# Patient Record
Sex: Female | Born: 1985 | Race: Black or African American | Hispanic: No | Marital: Single | Smoking: Former smoker
Health system: Southern US, Community
[De-identification: ages and names within clinical notes are randomized; demographics above are authoritative.]

## PROBLEM LIST (undated history)

## (undated) DIAGNOSIS — F32A Depression, unspecified: Secondary | ICD-10-CM

## (undated) DIAGNOSIS — J45909 Unspecified asthma, uncomplicated: Secondary | ICD-10-CM

## (undated) DIAGNOSIS — IMO0001 Reserved for inherently not codable concepts without codable children: Secondary | ICD-10-CM

## (undated) DIAGNOSIS — F329 Major depressive disorder, single episode, unspecified: Secondary | ICD-10-CM

## (undated) DIAGNOSIS — I10 Essential (primary) hypertension: Secondary | ICD-10-CM

## (undated) DIAGNOSIS — F419 Anxiety disorder, unspecified: Secondary | ICD-10-CM

## (undated) DIAGNOSIS — R112 Nausea with vomiting, unspecified: Secondary | ICD-10-CM

## (undated) DIAGNOSIS — Z9889 Other specified postprocedural states: Secondary | ICD-10-CM

## (undated) HISTORY — PX: ADENOIDECTOMY: SUR15

## (undated) HISTORY — PX: TONSILLECTOMY: SUR1361

## (undated) HISTORY — PX: SALPINGOOPHORECTOMY: SHX82

---
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
## 2008-08-09 ENCOUNTER — Ambulatory Visit: Payer: Self-pay | Admitting: *Deleted

## 2008-08-09 ENCOUNTER — Inpatient Hospital Stay (HOSPITAL_COMMUNITY): Admission: RE | Admit: 2008-08-09 | Discharge: 2008-08-12 | Payer: Self-pay | Admitting: *Deleted

## 2010-08-21 LAB — CBC
HCT: 36.5 % (ref 36.0–46.0)
Hemoglobin: 12 g/dL (ref 12.0–15.0)
MCHC: 32.9 g/dL (ref 30.0–36.0)
RBC: 4.6 MIL/uL (ref 3.87–5.11)
WBC: 5.3 10*3/uL (ref 4.0–10.5)

## 2010-08-21 LAB — URINALYSIS, ROUTINE W REFLEX MICROSCOPIC
Bilirubin Urine: NEGATIVE
Hgb urine dipstick: NEGATIVE
Protein, ur: NEGATIVE mg/dL
Specific Gravity, Urine: 1.031 — ABNORMAL HIGH (ref 1.005–1.030)
pH: 6.5 (ref 5.0–8.0)

## 2010-08-21 LAB — COMPREHENSIVE METABOLIC PANEL
Albumin: 3.5 g/dL (ref 3.5–5.2)
BUN: 12 mg/dL (ref 6–23)
CO2: 25 mEq/L (ref 19–32)
Calcium: 9 mg/dL (ref 8.4–10.5)
Chloride: 107 mEq/L (ref 96–112)
Creatinine, Ser: 0.76 mg/dL (ref 0.4–1.2)
Potassium: 3.5 mEq/L (ref 3.5–5.1)
Sodium: 139 mEq/L (ref 135–145)

## 2010-08-21 LAB — DRUGS OF ABUSE SCREEN W/O ALC, ROUTINE URINE
Amphetamine Screen, Ur: NEGATIVE
Barbiturate Quant, Ur: NEGATIVE
Phencyclidine (PCP): NEGATIVE

## 2010-08-21 LAB — TSH: TSH: 1.707 u[IU]/mL (ref 0.350–4.500)

## 2010-08-21 LAB — THC (MARIJUANA), URINE, CONFIRMATION: Marijuana, Ur-Confirmation: 28 ng/mL

## 2010-09-27 NOTE — Discharge Summary (Signed)
NAME:  Candace Sanders, Candace Sanders NO.:  [id number]   MEDICAL RECORD NO.:  [id number]          PATIENT TYPE:  IPS   LOCATION:  0302                          FACILITY:  [facility]   PHYSICIAN:  Jasmine Pang, M.D. DATE OF BIRTH:  04-18-1986   DATE OF ADMISSION:  08/09/2008  DATE OF DISCHARGE:  08/12/2008                               DISCHARGE SUMMARY   IDENTIFICATION:  This is a 25 year old single African American female  who was admitted on a voluntary basis on August 09, 2008.   HISTORY OF PRESENT ILLNESS:  The patient has had suicidal thoughts that  have been worse for the past 2 weeks.  She reports neurovegetative  symptoms including frequent tearfulness, poor concentration, unable to  sleep, anhedonia, and anergia.  For further admission information, see  psychiatric admission assessment.   PHYSICAL FINDINGS:  There were no acute physical or medical problems  noted.  Physical exam was unremarkable.   ADMISSION LABORATORIES:  TSH was within normal limits.   HOSPITAL COURSE:  Upon admission, the patient was continued on her home  supply of Sprintec birth control pill (0.25/0.0035) p.o. daily.  She was  also started on Ambien 5 mg 1-2 pills at bedtime.  In individual  sessions, the patient stated she has been depressed since elementary  school.  She admits to using occasional alcohol on weekends, but denies  this is excessive.  She denied any abuse as a child.  She stated her  parents were supportive.  She has gone to [facility] for treatment  in the past.  Stressors revolve around being disappointed in self.  She worries that her parents were disappointed with her.  She states she  has difficulty keeping friends.  She feels she is not doing well in  school at [employer].  As hospitalization progressed, sleep was  good.  Appetite was good.  The patient's mood began to improve.  She  discussed the recent breakup with a boyfriend and was sad about this.  Parents  had been visiting while she was in the hospital and are very  supportive.  She decided she did not want any psychiatric meds.  We  decided to refer her to therapy as an outpatient.  She also wanted to  return to school.  On August 12, 2008, family session was held with the  patient's parents.  They were very supportive and wanted her to be more  open with them.  The patient's mood on August 12, 2008 was euthymic.  Affect was wide range.  There was no suicidal or homicidal ideation.  No  thoughts of self-injurious behavior.  No auditory or visual  hallucinations.  No paranoia or delusions.  Thoughts were logical and  goal-directed.  Thought content, no predominant theme.  Cognitive was  grossly intact.  Insight good.  Judgment good.  Impulse control good.  It was felt the patient was safe for discharge today.   DISCHARGE DIAGNOSES:  Axis I: Depressive disorder, not otherwise  specified.  Axis II:  None.  Axis III:  No diagnosis.  Axis IV:  Moderate to  severe (relationship and school stressors, burden  of psychiatric illness).  Axis V:  Global assessment of functioning was 60 upon discharge.  GAF  was 54 upon admission.  GAF highest past year was 70.   DISCHARGE PLANS:  There was no specific activity level or dietary  restrictions.   POSTHOSPITAL CARE PLANS:  The patient will go to [facility] on  April 13 at 2 o'clock p.m.  She will also go to [facility] in [place] for a counselor and psychiatrist.   DISCHARGE MEDICATIONS:  Sprintec 0.25 mg/0.035 mg tablets as directed by  her primary care doctor.      Jasmine Pang, M.D.  Electronically Signed     [facility]/MEDQ  D:  08/24/2008  T:  08/25/2008  Job:  [id number]

## 2014-04-28 ENCOUNTER — Emergency Department
Admission: EM | Admit: 2014-04-28 | Discharge: 2014-04-28 | Disposition: A | Discharge status: Home / Self Care | Payer: Managed Care, Other (non HMO) | Source: Home / Self Care | Attending: Emergency Medicine | Admitting: Emergency Medicine

## 2014-04-28 ENCOUNTER — Encounter: Payer: Self-pay | Admitting: *Deleted

## 2014-04-28 DIAGNOSIS — J0100 Acute maxillary sinusitis, unspecified: Secondary | ICD-10-CM

## 2014-04-28 MED ORDER — AMOXICILLIN 875 MG PO TABS: ORAL_TABLET | ORAL | Status: DC

## 2014-04-28 MED ORDER — FLUTICASONE PROPIONATE 50 MCG/ACT NA SUSP: NASAL | Status: DC

## 2014-04-28 NOTE — ED Notes (Signed)
Merri c/o congestion, cough, ear itching, sore throat and hoarseness 2 days. Taken [employer], advertising OTC. No flu vac this season

## 2014-04-28 NOTE — ED Provider Notes (Signed)
CSN: [id number]     Arrival date & time 04/28/14  0847 History   First MD Initiated Contact with Patient 04/28/14 [phone]     Chief Complaint  Patient presents with  . Nasal Congestion  . Cough   (Consider location/radiation/quality/duration/timing/severity/associated sxs/prior Treatment) HPI SINUSITIS  Onset: 3-4 days Facial/sinus pressure with discolored nasal mucus.    Severity: moderate Tried OTC meds without significant relief.  Symptoms:  + Fever  + URI prodrome with nasal congestion + Minimal swollen neck glands + mild Sinus Headache + mild ear pressure  No Allergy symptoms No significant Sore Throat No eye symptoms     No significant Cough No chest pain No shortness of breath  No wheezing  No Abdominal Pain No Nausea No Vomiting No diarrhea  No Myalgias No focal neurologic symptoms No syncope No Rash  No Urinary symptoms   Denies chance of pregnancy. LMP 04/04/2014       History reviewed. No pertinent past medical history. Past Surgical History  Procedure Laterality Date  . Tonsillectomy     Family History  Problem Relation Age of Onset  . Hyperlipidemia Mother   . Hypertension Father    History  Substance Use Topics  . Smoking status: Never Smoker   . Smokeless tobacco: Current User     Comment: vape  . Alcohol Use: Yes   OB History    No data available     Review of Systems  All other systems reviewed and are negative.   Allergies  Review of patient's allergies indicates no known allergies.  Home Medications   Prior to Admission medications   Medication Sig Start Date End Date Taking? Authorizing Provider  norethindrone-ethinyl estradiol-iron (ESTROSTEP FE,TILIA FE,TRI-LEGEST FE) 1-20/1-30/1-35 MG-MCG tablet Take 1 tablet by mouth daily.   Yes Historical Provider, MD  amoxicillin (AMOXIL) 875 MG tablet Take 1 twice a day X 10 days. 04/28/14   Jacqulyn Cane, MD  fluticasone Asencion Islam) 50 MCG/ACT nasal spray 1 or 2 sprays each  nostril twice a day 04/28/14   Jacqulyn Cane, MD   BP 144/87 mmHg  Pulse 94  Temp(Src) 98.2 F (36.8 C) (Oral)  Resp 16  Ht 5\' 7"  (1.702 m)  Wt 352 lb (159.666 kg)  BMI 55.12 kg/m2  SpO2 98%  LMP 04/04/2014 Physical Exam  Constitutional: She is oriented to person, place, and time. She appears well-developed and well-nourished. No distress.  HENT:  Head: Normocephalic and atraumatic.  Right Ear: Tympanic membrane, external ear and ear canal normal.  Left Ear: Tympanic membrane, external ear and ear canal normal.  Nose: Mucosal edema and rhinorrhea present. Right sinus exhibits maxillary sinus tenderness. Left sinus exhibits maxillary sinus tenderness.  Mouth/Throat: Oropharynx is clear and moist. No oral lesions. No oropharyngeal exudate.  Tonsils are surgically absent.  Eyes: Right eye exhibits no discharge. Left eye exhibits no discharge. No scleral icterus.  Neck: Neck supple.  Cardiovascular: Normal rate, regular rhythm and normal heart sounds.   Pulmonary/Chest: Effort normal and breath sounds normal. She has no wheezes. She has no rales.  Lymphadenopathy:    She has no cervical adenopathy.  Neurological: She is alert and oriented to person, place, and time.  Skin: Skin is warm and dry.  Nursing note and vitals reviewed.   ED Course  Procedures (including critical care time) Labs Review Labs Reviewed - No data to display  Imaging Review No results found.   MDM   1. Acute maxillary sinusitis, recurrence not specified   Treatment  options discussed, as well as risks, benefits, alternatives. Patient voiced understanding and agreement with the following plans:  New Prescriptions   AMOXICILLIN (AMOXIL) 875 MG TABLET    Take 1 twice a day X 10 days.   FLUTICASONE (FLONASE) 50 MCG/ACT NASAL SPRAY    1 or 2 sprays each nostril twice a day   Other symptomatic care discussed. Follow-up with your primary care doctor in 5-7 days if not improving, or sooner if symptoms become  worse. Precautions discussed. Red flags discussed. Questions invited and answered. Patient voiced understanding and agreement.     Jacqulyn Cane, MD 04/28/14 [phone]

## 2014-05-10 ENCOUNTER — Encounter: Payer: Self-pay | Admitting: *Deleted

## 2014-05-10 ENCOUNTER — Emergency Department (INDEPENDENT_AMBULATORY_CARE_PROVIDER_SITE_OTHER)
Admission: EM | Admit: 2014-05-10 | Discharge: 2014-05-10 | Disposition: A | Discharge status: Home / Self Care | Payer: Managed Care, Other (non HMO) | Source: Home / Self Care | Attending: Family Medicine | Admitting: Family Medicine

## 2014-05-10 DIAGNOSIS — L500 Allergic urticaria: Secondary | ICD-10-CM

## 2014-05-10 DIAGNOSIS — J029 Acute pharyngitis, unspecified: Secondary | ICD-10-CM

## 2014-05-10 MED ORDER — PREDNISONE 20 MG PO TABS: 20.0000 mg | ORAL_TABLET | Freq: Two times a day (BID) | ORAL | Status: DC

## 2014-05-10 MED ORDER — METHYLPREDNISOLONE SODIUM SUCC 125 MG IJ SOLR
80.0000 mg | Freq: Once | INTRAMUSCULAR | Status: AC
  Administered 2014-05-10: 80 mg via INTRAMUSCULAR

## 2014-05-10 NOTE — ED Notes (Addendum)
Pt c/o allergic reaction to laundry detergent with hives all over x 7 pm last night.

## 2014-05-10 NOTE — Discharge Instructions (Signed)
Begin prednisone tomorrow.  Take a daily antihistamine such as Zyrtec or Allegra.   Hives Hives are itchy, red, swollen areas of the skin. They can vary in size and location on your body. Hives can come and go for hours or several days (acute hives) or for several weeks (chronic hives). Hives do not spread from person to person (noncontagious). They may get worse with scratching, exercise, and emotional stress. CAUSES   Allergic reaction to food, additives, or drugs.  Infections, including the common cold.  Illness, such as vasculitis, lupus, or thyroid disease.  Exposure to sunlight, heat, or cold.  Exercise.  Stress.  Contact with chemicals. SYMPTOMS   Red or white swollen patches on the skin. The patches may change size, shape, and location quickly and repeatedly.  Itching.  Swelling of the hands, feet, and face. This may occur if hives develop deeper in the skin. DIAGNOSIS  Your caregiver can usually tell what is wrong by performing a physical exam. Skin or blood tests may also be done to determine the cause of your hives. In some cases, the cause cannot be determined. TREATMENT  Mild cases usually get better with medicines such as antihistamines. Severe cases may require an emergency epinephrine injection. If the cause of your hives is known, treatment includes avoiding that trigger.  HOME CARE INSTRUCTIONS   Avoid causes that trigger your hives.  Take antihistamines as directed by your caregiver to reduce the severity of your hives. Non-sedating or low-sedating antihistamines are usually recommended. Do not drive while taking an antihistamine.  Take any other medicines prescribed for itching as directed by your caregiver.  Wear loose-fitting clothing.  Keep all follow-up appointments as directed by your caregiver. SEEK MEDICAL CARE IF:   You have persistent or severe itching that is not relieved with medicine.  You have painful or swollen joints. SEEK IMMEDIATE  MEDICAL CARE IF:   You have a fever.  Your tongue or lips are swollen.  You have trouble breathing or swallowing.  You feel tightness in the throat or chest.  You have abdominal pain. These problems may be the first sign of a life-threatening allergic reaction. Call your local emergency services (911 in [place] MAKE SURE YOU:   Understand these instructions.  Will watch your condition.  Will get help right away if you are not doing well or get worse. Document Released: 04/28/2005 Document Revised: 05/03/2013 Document Reviewed: 07/22/2011 [facility] Patient Information 2015 [place], [place]. This information is not intended to replace advice given to you by your health care provider. Make sure you discuss any questions you have with your health care provider.

## 2014-05-10 NOTE — ED Provider Notes (Signed)
CSN: [id number]     Arrival date & time 05/10/14  6301 History   First MD Initiated Contact with Patient 05/10/14 [phone]     Chief Complaint  Patient presents with  . Allergic Reaction     HPI Comments: Patient complains of onset of hives in her groin area and upper inner thighs yesterday.   The hives then appeared on her arms, hands, and torso.  She has also had some itching behind her ears.  She also notes onset of a mild sore throat.  No fevers, chills, and sweats.  She reports that her previous URI and sinusitis resolved; she finished amoxicillin 3 days ago.  Patient is a 28 y.o. female presenting with rash. The history is provided by the patient.  Rash Location:  Torso, leg and hand Hand rash location:  L hand and R hand Leg rash location:  L upper leg and R upper leg Quality: itchiness   Quality: not blistering and not painful   Severity:  Mild Onset quality:  Sudden Duration:  1 day Timing:  Constant Progression:  Worsening Chronicity:  New Context: new detergent/soap   Relieved by:  Nothing Worsened by:  Nothing tried Ineffective treatments:  Antihistamines and topical steroids Associated symptoms: sore throat   Associated symptoms: no fatigue, no fever, no headaches, no myalgias, no nausea, no throat swelling, no tongue swelling and no URI   Sore throat:    Severity:  Mild   Onset quality:  Gradual   Duration:  1 day   Timing:  Constant   Progression:  Unchanged   History reviewed. No pertinent past medical history. Past Surgical History  Procedure Laterality Date  . Tonsillectomy     Family History  Problem Relation Age of Onset  . Hyperlipidemia Mother   . Hypertension Father    History  Substance Use Topics  . Smoking status: Never Smoker   . Smokeless tobacco: Current User     Comment: vape  . Alcohol Use: Yes   OB History    No data available     Review of Systems  Constitutional: Negative for fever and fatigue.  HENT: Positive for sore throat.     Gastrointestinal: Negative for nausea.  Musculoskeletal: Negative for myalgias.  Skin: Positive for rash.  Neurological: Negative for headaches.  All other systems reviewed and are negative.   Allergies  Review of patient's allergies indicates no known allergies.  Home Medications   Prior to Admission medications   Medication Sig Start Date End Date Taking? Authorizing Provider  diphenhydrAMINE (BENADRYL) 25 mg capsule Take 25 mg by mouth every 6 (six) hours as needed.   Yes Historical Provider, MD  amoxicillin (AMOXIL) 875 MG tablet Take 1 twice a day X 10 days. 04/28/14   Jacqulyn Cane, MD  fluticasone Asencion Islam) 50 MCG/ACT nasal spray 1 or 2 sprays each nostril twice a day 04/28/14   Jacqulyn Cane, MD  norethindrone-ethinyl estradiol-iron (ESTROSTEP FE,TILIA FE,TRI-LEGEST FE) 1-20/1-30/1-35 MG-MCG tablet Take 1 tablet by mouth daily.    Historical Provider, MD  predniSONE (DELTASONE) 20 MG tablet Take 1 tablet (20 mg total) by mouth 2 (two) times daily. Take with food. 05/10/14   Kandra Nicolas, MD   BP 159/96 mmHg  Pulse 110  Temp(Src) 98.1 F (36.7 C) (Oral)  Resp 18  Ht 5\' 7"  (1.702 m)  Wt 352 lb (159.666 kg)  BMI 55.12 kg/m2  SpO2 97%  LMP 05/02/2014 Physical Exam  Constitutional: She is oriented to person, place,  and time. She appears well-developed and well-nourished. No distress.  Patient is obese (BMI 55.1)  HENT:  Head: Normocephalic and atraumatic.  Nose: Nose normal.  Mouth/Throat: Oropharynx is clear and moist.  Eyes: Conjunctivae and EOM are normal. Pupils are equal, round, and reactive to light.  Neck: Neck supple.  Tender shotty anterior nodes bilaterally  Cardiovascular: Normal heart sounds.   Pulmonary/Chest: Breath sounds normal.  Lymphadenopathy:    She has cervical adenopathy.  Neurological: She is alert and oriented to person, place, and time.  Skin: Skin is warm and dry. Rash noted. Rash is urticarial. No erythema.     Nursing note and vitals  reviewed.   ED Course  Procedures  None   Labs Reviewed  STREP A DNA PROBE  POCT RAPID STREP A (OFFICE) negative      MDM   1. Allergic urticaria   2. Acute pharyngitis, unspecified pharyngitis type    Throat culture pending. Solumedrol 80mg  IM; begin prednisone burst tomorrow.   Take a daily antihistamine such as Zyrtec or Allegra. Followup with Family Doctor if not improved in one week.     Kandra Nicolas, MD 05/10/14 [phone]

## 2014-05-11 LAB — STREP A DNA PROBE: GASP: NEGATIVE

## 2014-05-13 ENCOUNTER — Telehealth: Payer: Self-pay | Admitting: *Deleted

## 2014-10-30 ENCOUNTER — Encounter: Payer: Self-pay | Admitting: *Deleted

## 2014-10-30 ENCOUNTER — Emergency Department
Admission: EM | Admit: 2014-10-30 | Discharge: 2014-10-30 | Disposition: A | Discharge status: Home / Self Care | Payer: Managed Care, Other (non HMO) | Source: Home / Self Care | Attending: Family Medicine | Admitting: Family Medicine

## 2014-10-30 DIAGNOSIS — J069 Acute upper respiratory infection, unspecified: Secondary | ICD-10-CM

## 2014-10-30 DIAGNOSIS — B9789 Other viral agents as the cause of diseases classified elsewhere: Principal | ICD-10-CM

## 2014-10-30 HISTORY — DX: Essential (primary) hypertension: I10

## 2014-10-30 MED ORDER — BENZONATATE 200 MG PO CAPS
200.0000 mg | ORAL_CAPSULE | Freq: Every day | ORAL | Status: DC
Start: 2014-10-30 — End: 2015-01-03

## 2014-10-30 MED ORDER — PREDNISONE 20 MG PO TABS: 20.0000 mg | ORAL_TABLET | Freq: Two times a day (BID) | ORAL | Status: DC

## 2014-10-30 MED ORDER — AZITHROMYCIN 250 MG PO TABS: ORAL_TABLET | ORAL | Status: DC

## 2014-10-30 NOTE — Discharge Instructions (Signed)
Take plain guaifenesin (1200mg  extended release tabs such as Mucinex) twice daily, with plenty of water, for cough and congestion.  May add Pseudoephedrine (30mg , one or two every 4 to 6 hours) for sinus congestion.  Get adequate rest.   May use Afrin nasal spray (or generic oxymetazoline) twice daily for about 5 days.  Also recommend using saline nasal spray several times daily and saline nasal irrigation (AYR is a common brand).  Use Flonase nasal spray each morning after using Afrin nasal spray and saline nasal irrigation. Try warm salt water gargles for sore throat.  Stop all antihistamines for now, and other non-prescription cough/cold preparations. May take Ibuprofen 200mg , 4 tabs every 8 hours with food for sore throat, headache, etc. Begin Azithromycin if not improving about one week or if persistent fever develops   Follow-up with family doctor if not improving about10 days.

## 2014-10-30 NOTE — ED Notes (Signed)
Pt c/o productive cough, hoarseness and post nasal drip x 5 days. Denies fever.

## 2014-10-30 NOTE — ED Provider Notes (Signed)
CSN: [id number]     Arrival date & time 10/30/14  5621 History   First MD Initiated Contact with Patient 10/30/14 [phone]     Chief Complaint  Patient presents with  . Cough     HPI Comments: Patient complains of five day history of typical cold-like symptoms including mild sore throat, sinus congestion, headache, hoarseness, and cough.  She has had mild hives over the past several days improved with Benadryl. She has a history of seasonal allergies, and past history of childhood asthma.  She has a family history of asthma.  The history is provided by the patient.    Past Medical History  Diagnosis Date  . Hypertension    Past Surgical History  Procedure Laterality Date  . Tonsillectomy    . Adenoidectomy     Family History  Problem Relation Age of Onset  . Hyperlipidemia Mother   . Hypertension Father    History  Substance Use Topics  . Smoking status: Former Smoker    Quit date: 10/30/2010  . Smokeless tobacco: Current User     Comment: vape  . Alcohol Use: Yes   OB History    No data available     Review of Systems + sore throat + cough No pleuritic pain No wheezing + nasal congestion + post-nasal drainage No sinus pain/pressure No itchy/red eyes No earache No hemoptysis No SOB No fever/chills No nausea No vomiting No abdominal pain No diarrhea No urinary symptoms No skin rash No fatigue No myalgias + headache Used OTC meds without relief  Allergies  Review of patient's allergies indicates no known allergies.  Home Medications   Prior to Admission medications   Medication Sig Start Date End Date Taking? Authorizing Provider  azithromycin (ZITHROMAX Z-PAK) 250 MG tablet Take 2 tabs today; then begin one tab once daily for 4 more days. (Rx void after 11/07/14) 10/30/14   Kandra Nicolas, MD  benzonatate (TESSALON) 200 MG capsule Take 1 capsule (200 mg total) by mouth at bedtime. Take as needed for cough 10/30/14   Kandra Nicolas, MD  diphenhydrAMINE  (BENADRYL) 25 mg capsule Take 25 mg by mouth every 6 (six) hours as needed.    Historical Provider, MD  fluticasone Asencion Islam) 50 MCG/ACT nasal spray 1 or 2 sprays each nostril twice a day 04/28/14   Jacqulyn Cane, MD  norethindrone-ethinyl estradiol-iron (ESTROSTEP FE,TILIA FE,TRI-LEGEST FE) 1-20/1-30/1-35 MG-MCG tablet Take 1 tablet by mouth daily.    Historical Provider, MD  predniSONE (DELTASONE) 20 MG tablet Take 1 tablet (20 mg total) by mouth 2 (two) times daily. Take with food. 10/30/14   Kandra Nicolas, MD   BP 139/99 mmHg  Pulse 90  Temp(Src) 98.6 F (37 C) (Oral)  Resp 16  Ht 5\' 7"  (1.702 m)  Wt 357 lb (161.934 kg)  BMI 55.90 kg/m2  SpO2 98%  LMP 10/17/2014 Physical Exam Nursing notes and Vital Signs reviewed. Appearance:  Patient appears stated age, and in no acute distress.  Patient is obese (BMI 55.9) Eyes:  Pupils are equal, round, and reactive to light and accomodation.  Extraocular movement is intact.  Conjunctivae are not inflamed  Ears:  Canals normal.  Tympanic membranes normal.  Nose:  Congested turbinates.  No sinus tenderness.   Pharynx:  Normal Neck:  Supple.  Tender enlarged posterior nodes are palpated bilaterally  Lungs:  Clear to auscultation.  Breath sounds are equal.  Chest:  Distinct tenderness to palpation over the mid-sternum.  Heart:  Regular  rate and rhythm without murmurs, rubs, or gallops.  Abdomen:  Nontender without masses or hepatosplenomegaly.  Bowel sounds are present.  No CVA or flank tenderness.  Extremities:  No edema.  No calf tenderness Skin:  No rash present.    ED Course  Procedures  none   MDM   1. Viral URI with cough    There is no evidence of bacterial infection today.  Treat symptomatically for now  Prescription written for Benzonatate (Tessalon) to take at bedtime for night-time cough.  Begin prednisone burst. Take plain guaifenesin (1200mg  extended release tabs such as Mucinex) twice daily, with plenty of water, for cough  and congestion.  May add Pseudoephedrine (30mg , one or two every 4 to 6 hours) for sinus congestion.  Get adequate rest.   May use Afrin nasal spray (or generic oxymetazoline) twice daily for about 5 days.  Also recommend using saline nasal spray several times daily and saline nasal irrigation (AYR is a common brand).  Use Flonase nasal spray each morning after using Afrin nasal spray and saline nasal irrigation. Try warm salt water gargles for sore throat.  Stop all antihistamines for now, and other non-prescription cough/cold preparations. May take Ibuprofen 200mg , 4 tabs every 8 hours with food for sore throat, headache, etc. Begin Azithromycin if not improving about one week or if persistent fever develops (Given a prescription to hold, with an expiration date)  Follow-up with family doctor if not improving about10 days.     Kandra Nicolas, MD 10/30/14 [phone]

## 2014-12-22 ENCOUNTER — Other Ambulatory Visit: Payer: Self-pay | Admitting: Obstetrics and Gynecology

## 2014-12-26 ENCOUNTER — Encounter (HOSPITAL_COMMUNITY)
Admission: RE | Admit: 2014-12-26 | Discharge: 2014-12-26 | Disposition: A | Discharge status: Home / Self Care | Payer: Managed Care, Other (non HMO) | Source: Ambulatory Visit | Attending: Obstetrics and Gynecology | Admitting: Obstetrics and Gynecology

## 2014-12-26 ENCOUNTER — Encounter (HOSPITAL_COMMUNITY): Payer: Self-pay

## 2014-12-26 DIAGNOSIS — N8320 Unspecified ovarian cysts: Secondary | ICD-10-CM | POA: Insufficient documentation

## 2014-12-26 DIAGNOSIS — Z01818 Encounter for other preprocedural examination: Secondary | ICD-10-CM | POA: Insufficient documentation

## 2014-12-26 HISTORY — DX: Major depressive disorder, single episode, unspecified: F32.9

## 2014-12-26 HISTORY — DX: Anxiety disorder, unspecified: F41.9

## 2014-12-26 HISTORY — DX: Unspecified asthma, uncomplicated: J45.909

## 2014-12-26 HISTORY — DX: Other specified postprocedural states: R11.2

## 2014-12-26 HISTORY — DX: Depression, unspecified: F32.A

## 2014-12-26 HISTORY — DX: Reserved for inherently not codable concepts without codable children: IMO0001

## 2014-12-26 HISTORY — DX: Other specified postprocedural states: Z98.890

## 2014-12-26 LAB — CBC
HEMATOCRIT: 37 % (ref 36.0–46.0)
HEMOGLOBIN: 12.4 g/dL (ref 12.0–15.0)
MCH: 26.1 pg (ref 26.0–34.0)
MCHC: 33.5 g/dL (ref 30.0–36.0)
MCV: 77.7 fL — AB (ref 78.0–100.0)
Platelets: 318 10*3/uL (ref 150–400)
RBC: 4.76 MIL/uL (ref 3.87–5.11)
RDW: 14.5 % (ref 11.5–15.5)
WBC: 9.4 10*3/uL (ref 4.0–10.5)

## 2014-12-26 LAB — TYPE AND SCREEN
ABO/RH(D): O POS
Antibody Screen: NEGATIVE

## 2014-12-26 LAB — ABO/RH: ABO/RH(D): O POS

## 2014-12-26 NOTE — Pre-Procedure Instructions (Signed)
Various BP readings were obtained on this patient. I used the largest cuff I had- adult long- and it fit snugly. I used both arms, upper and forearms.I sent a note to Dr. Philis Pique (via pt who had appt with Philis Pique after leaving here) with BP readings taken over a 30 min period.Marland Kitchen..158/98, 169/104, 163/92. Patient states she is very anxious and usually has "white coat syndrome". She says her BP readings are usually around 130/80.[facility] not notified as yet since 2 readings were below the protocol to call them.

## 2014-12-26 NOTE — Patient Instructions (Signed)
Your procedure is scheduled on:01/03/15  Enter through the Main Entrance at :6am Pick up desk phone and dial [phone] and inform us of your arrival.  Please call [phone] if you have any problems the morning of surgery.  Remember: Do not eat food or drink liquids, including water, after midnight:Tuesday    You may brush your teeth the morning of surgery.  DO NOT wear jewelry, eye make-up, lipstick,body lotion, or dark fingernail polish.  (Polished toes are ok) You may wear deodorant.  If you are to be admitted after surgery, leave suitcase in car until your room has been assigned. Patients discharged on the day of surgery will not be allowed to drive home. Wear loose fitting, comfortable clothes for your ride home.

## 2014-12-27 NOTE — Anesthesia Preprocedure Evaluation (Addendum)
Anesthesia Evaluation  Patient identified by MRN, date of birth, ID band Patient awake    Reviewed: Allergy & Precautions, NPO status , Patient's Chart, lab work & pertinent test results  History of Anesthesia Complications (+) PONV  Airway Mallampati: II   Neck ROM: Full    Dental  (+) Dental Advisory Given, Teeth Intact   Pulmonary Shortness of breath: suspect OSA., asthma , former smoker (quit 2012),  breath sounds clear to auscultation        Cardiovascular hypertension, Pt. on medications Rhythm:Regular     Neuro/Psych Anxiety Depression    GI/Hepatic   Endo/Other  Morbid obesityBMI 54  Renal/GU      Musculoskeletal   Abdominal (+) + obese,   Peds  Hematology 12/37   Anesthesia Other Findings   Reproductive/Obstetrics                        Anesthesia Physical Anesthesia Plan  ASA: III  Anesthesia Plan: General   Post-op Pain Management:    Induction: Intravenous  Airway Management Planned: Oral ETT and Video Laryngoscope Planned  Additional Equipment:   Intra-op Plan:   Post-operative Plan: Extubation in OR  Informed Consent: I have reviewed the patients History and Physical, chart, labs and discussed the procedure including the risks, benefits and alternatives for the proposed anesthesia with the patient or authorized representative who has indicated his/her understanding and acceptance.     Plan Discussed with:   Anesthesia Plan Comments: (2nd IV, modified rapid sequence induction, multimodal pain RX with Precedex available, recover head up 45 degrees or more, head at top of table for induction, Nasal trumpet after intubation)       Anesthesia Quick Evaluation

## 2015-01-01 NOTE — H&P (Signed)
29 y.o. yo complains of a complex ovarian cyst with continued pain.  Her pain comes and goes but is not excruciating. Periods are fine on OCPs- occ lasting 7 days, sometimes 4 days.  On her [place] R ovarian cyst became complex with solid appearing areas inside of about 1 cm. The cyst measures 11x8x9 cm; areas around cyst wall, solid appearing but no increase in blood flow. Previously the cyst had measured 8cm, then 9 cm and was simple.  Mom and sister have umbilical hernias. Her CA 125 was 14 and she had a neg CEA. BPs are elevated at preop- 150-160/90s. No hx of HTN.Two days before surgery her BPs were 140s/90s.  Past Medical History  Diagnosis Date  . Hypertension     no meds  . Shortness of breath dyspnea   . Asthma     childhood  . Depression   . Anxiety   . PONV (postoperative nausea and vomiting)     nausea   Past Surgical History  Procedure Laterality Date  . Tonsillectomy    . Adenoidectomy      Social History   Social History  . Marital Status: Single    Spouse Name: N/A  . Number of Children: N/A  . Years of Education: N/A   Occupational History  . Not on file.   Social History Main Topics  . Smoking status: Former Smoker    Quit date: 10/30/2010  . Smokeless tobacco: Current User     Comment: vape  . Alcohol Use: Yes     Comment: socially  . Drug Use: Yes    Special: Marijuana  . Sexual Activity: Not on file   Other Topics Concern  . Not on file   Social History Narrative    No current facility-administered medications on file prior to encounter.   Current Outpatient Prescriptions on File Prior to Encounter  Medication Sig Dispense Refill  . azithromycin (ZITHROMAX Z-PAK) 250 MG tablet Take 2 tabs today; then begin one tab once daily for 4 more days. (Rx void after 11/07/14) (Patient not taking: Reported on 12/13/2014) 6 tablet 0  . benzonatate (TESSALON) 200 MG capsule Take 1 capsule (200 mg total) by mouth at bedtime. Take as needed for cough (Patient not  taking: Reported on 12/13/2014) 12 capsule 0  . fluticasone (FLONASE) 50 MCG/ACT nasal spray 1 or 2 sprays each nostril twice a day (Patient not taking: Reported on 12/13/2014) 16 g 0  . predniSONE (DELTASONE) 20 MG tablet Take 1 tablet (20 mg total) by mouth 2 (two) times daily. Take with food. (Patient not taking: Reported on 12/13/2014) 10 tablet 0    No Known Allergies  @VITALS2 @  Lungs: clear to ascultation Cor:  RRR Abdomen:  soft, nontender, nondistended. Ex:  No Pelvic:Vulva: no masses, atrophy, or lesions. Vagina: no tenderness, erythema, cystocele, rectocele, abnormal vaginal discharge, or vesicle(s) or ulcers. Cervix: no discharge or cervical motion tenderness and grossly normal and sample taken for a Pap smear. Uterus: normal shape and size (7) and midline, non-tender, and no uterine prolapse. Bladder/Urethra: no urethral discharge or mass and normal meatus, bladder non distended, and Urethra well supported. Adnexa/Parametria: no parametrial tenderness or mass and bulge on right.  A:  29 y.o. G0 with increasing and more complex cyst on RO.  Pt also having pain from cyst.  Not likely ovarian ca.  For cystectomy if able to separate from healthy ovarian tissue; for oopherectomy if unable to separate cyst without rupturing it.  Pt consented  for both as well as lysis of adhesions.  Pt understands that if it is deemed dangerous to proceed or if appears to be carcinoma, we will abandon surgery.  For robotic assisted ovarian cystectomy vs. oopherectomy.  P: All risks, benefits and alternatives d/w patient and she desires to proceed.  Patient has undergone a modified bowel prep and will receive preop antibiotics and SCDs during the operation.     Ward Boissonneault A

## 2015-01-03 ENCOUNTER — Ambulatory Visit (HOSPITAL_COMMUNITY): Payer: Managed Care, Other (non HMO) | Admitting: Anesthesiology

## 2015-01-03 ENCOUNTER — Ambulatory Visit (HOSPITAL_COMMUNITY)
Admission: RE | Admit: 2015-01-03 | Discharge: 2015-01-03 | Disposition: A | Discharge status: Home / Self Care | Payer: Managed Care, Other (non HMO) | Source: Ambulatory Visit | Attending: Obstetrics and Gynecology | Admitting: Obstetrics and Gynecology

## 2015-01-03 ENCOUNTER — Encounter (HOSPITAL_COMMUNITY)
Admission: RE | Disposition: A | Discharge status: Home / Self Care | Payer: Self-pay | Source: Ambulatory Visit | Attending: Obstetrics and Gynecology

## 2015-01-03 DIAGNOSIS — F329 Major depressive disorder, single episode, unspecified: Secondary | ICD-10-CM | POA: Diagnosis not present

## 2015-01-03 DIAGNOSIS — Y9389 Activity, other specified: Secondary | ICD-10-CM | POA: Insufficient documentation

## 2015-01-03 DIAGNOSIS — Y998 Other external cause status: Secondary | ICD-10-CM | POA: Insufficient documentation

## 2015-01-03 DIAGNOSIS — F17290 Nicotine dependence, other tobacco product, uncomplicated: Secondary | ICD-10-CM | POA: Diagnosis not present

## 2015-01-03 DIAGNOSIS — D270 Benign neoplasm of right ovary: Secondary | ICD-10-CM | POA: Diagnosis not present

## 2015-01-03 DIAGNOSIS — S30816A Abrasion of unspecified external genital organs, female, initial encounter: Secondary | ICD-10-CM | POA: Insufficient documentation

## 2015-01-03 DIAGNOSIS — F1290 Cannabis use, unspecified, uncomplicated: Secondary | ICD-10-CM | POA: Diagnosis not present

## 2015-01-03 DIAGNOSIS — J45909 Unspecified asthma, uncomplicated: Secondary | ICD-10-CM | POA: Diagnosis not present

## 2015-01-03 DIAGNOSIS — Y635 Inappropriate temperature in local application and packing: Secondary | ICD-10-CM | POA: Diagnosis not present

## 2015-01-03 DIAGNOSIS — Z6841 Body Mass Index (BMI) 40.0 and over, adult: Secondary | ICD-10-CM | POA: Insufficient documentation

## 2015-01-03 DIAGNOSIS — Y92234 Operating room of hospital as the place of occurrence of the external cause: Secondary | ICD-10-CM | POA: Insufficient documentation

## 2015-01-03 DIAGNOSIS — I10 Essential (primary) hypertension: Secondary | ICD-10-CM | POA: Diagnosis not present

## 2015-01-03 DIAGNOSIS — N8320 Unspecified ovarian cysts: Secondary | ICD-10-CM | POA: Diagnosis present

## 2015-01-03 DIAGNOSIS — F419 Anxiety disorder, unspecified: Secondary | ICD-10-CM | POA: Diagnosis not present

## 2015-01-03 HISTORY — PX: ROBOTIC ASSISTED LAPAROSCOPIC PARTIAL CYSTECTOMY: SHX6480

## 2015-01-03 LAB — PREGNANCY, URINE: PREG TEST UR: NEGATIVE

## 2015-01-03 SURGERY — ROBOTIC ASSISTED LAPAROSCOPIC PARTIAL CYSTECTOMY
Anesthesia: General | Site: Abdomen

## 2015-01-03 MED ORDER — PROPOFOL 10 MG/ML IV BOLUS
INTRAVENOUS | Status: DC | PRN
Start: 2015-01-03 — End: 2015-01-03
  Administered 2015-01-03: 280 mg via INTRAVENOUS

## 2015-01-03 MED ORDER — LACTATED RINGERS IV SOLN
INTRAVENOUS | Status: DC
  Administered 2015-01-03 (×2): via INTRAVENOUS

## 2015-01-03 MED ORDER — NEOSTIGMINE METHYLSULFATE 10 MG/10ML IV SOLN
INTRAVENOUS | Status: AC
  Filled 2015-01-03: qty 1

## 2015-01-03 MED ORDER — GLYCOPYRROLATE 0.2 MG/ML IJ SOLN
INTRAMUSCULAR | Status: DC | PRN
  Administered 2015-01-03: 0.6 mg via INTRAVENOUS

## 2015-01-03 MED ORDER — OXYCODONE-ACETAMINOPHEN 5-325 MG PO TABS: 1.0000 | ORAL_TABLET | ORAL | Status: DC | PRN

## 2015-01-03 MED ORDER — OXYCODONE-ACETAMINOPHEN 5-325 MG PO TABS
1.0000 | ORAL_TABLET | Freq: Once | ORAL | Status: AC
  Administered 2015-01-03: 1 via ORAL

## 2015-01-03 MED ORDER — SCOPOLAMINE 1 MG/3DAYS TD PT72
MEDICATED_PATCH | TRANSDERMAL | Status: AC
  Filled 2015-01-03: qty 1

## 2015-01-03 MED ORDER — LIDOCAINE HCL (CARDIAC) 20 MG/ML IV SOLN
INTRAVENOUS | Status: DC | PRN
  Administered 2015-01-03: 100 mg via INTRAVENOUS

## 2015-01-03 MED ORDER — MIDAZOLAM HCL 2 MG/2ML IJ SOLN
INTRAMUSCULAR | Status: DC | PRN
  Administered 2015-01-03: 2 mg via INTRAVENOUS

## 2015-01-03 MED ORDER — LACTATED RINGERS IR SOLN
Status: DC | PRN
  Administered 2015-01-03: 3000 mL

## 2015-01-03 MED ORDER — DEXMEDETOMIDINE HCL 200 MCG/2ML IV SOLN
INTRAVENOUS | Status: DC | PRN
  Administered 2015-01-03 (×8): 10 ug via INTRAVENOUS

## 2015-01-03 MED ORDER — DEXAMETHASONE SODIUM PHOSPHATE 4 MG/ML IJ SOLN
INTRAMUSCULAR | Status: AC
  Filled 2015-01-03: qty 1

## 2015-01-03 MED ORDER — MIDAZOLAM HCL 2 MG/2ML IJ SOLN
INTRAMUSCULAR | Status: AC
  Filled 2015-01-03: qty 4

## 2015-01-03 MED ORDER — PROMETHAZINE HCL 25 MG/ML IJ SOLN
6.2500 mg | INTRAMUSCULAR | Status: DC | PRN
Start: 2015-01-03 — End: 2015-01-03

## 2015-01-03 MED ORDER — PROPOFOL 10 MG/ML IV BOLUS
INTRAVENOUS | Status: AC
  Filled 2015-01-03: qty 20

## 2015-01-03 MED ORDER — NEOSTIGMINE METHYLSULFATE 10 MG/10ML IV SOLN
INTRAVENOUS | Status: DC | PRN
  Administered 2015-01-03: 4 mg via INTRAVENOUS

## 2015-01-03 MED ORDER — ONDANSETRON HCL 4 MG/2ML IJ SOLN
INTRAMUSCULAR | Status: DC | PRN
  Administered 2015-01-03: 4 mg via INTRAVENOUS

## 2015-01-03 MED ORDER — LIDOCAINE HCL (CARDIAC) 20 MG/ML IV SOLN
INTRAVENOUS | Status: AC
Start: 2015-01-03 — End: 2015-01-03
  Filled 2015-01-03: qty 5

## 2015-01-03 MED ORDER — SUCCINYLCHOLINE CHLORIDE 20 MG/ML IJ SOLN
INTRAMUSCULAR | Status: DC | PRN
  Administered 2015-01-03: 120 mg via INTRAVENOUS

## 2015-01-03 MED ORDER — SODIUM CHLORIDE 0.9 % IJ SOLN
INTRAMUSCULAR | Status: AC
  Filled 2015-01-03: qty 100

## 2015-01-03 MED ORDER — GLYCOPYRROLATE 0.2 MG/ML IJ SOLN
INTRAMUSCULAR | Status: AC
Start: 2015-01-03 — End: 2015-01-03
  Filled 2015-01-03: qty 3

## 2015-01-03 MED ORDER — KETOROLAC TROMETHAMINE 30 MG/ML IJ SOLN
INTRAMUSCULAR | Status: AC
  Administered 2015-01-03: 30 mg
  Filled 2015-01-03: qty 1

## 2015-01-03 MED ORDER — OXYCODONE-ACETAMINOPHEN 5-325 MG PO TABS
ORAL_TABLET | ORAL | Status: AC
  Filled 2015-01-03: qty 1

## 2015-01-03 MED ORDER — ONDANSETRON HCL 4 MG/2ML IJ SOLN
INTRAMUSCULAR | Status: AC
  Filled 2015-01-03: qty 2

## 2015-01-03 MED ORDER — STERILE WATER FOR IRRIGATION IR SOLN
Status: DC | PRN
  Administered 2015-01-03: 3000 mL

## 2015-01-03 MED ORDER — KETOROLAC TROMETHAMINE 30 MG/ML IJ SOLN
INTRAMUSCULAR | Status: AC
  Filled 2015-01-03: qty 1

## 2015-01-03 MED ORDER — SCOPOLAMINE 1 MG/3DAYS TD PT72
1.0000 | MEDICATED_PATCH | Freq: Once | TRANSDERMAL | Status: DC
  Administered 2015-01-03: 1.5 mg via TRANSDERMAL

## 2015-01-03 MED ORDER — FENTANYL CITRATE (PF) 100 MCG/2ML IJ SOLN
INTRAMUSCULAR | Status: AC
  Administered 2015-01-03: 25 ug via INTRAVENOUS
  Filled 2015-01-03: qty 2

## 2015-01-03 MED ORDER — BUPIVACAINE LIPOSOME 1.3 % IJ SUSP
20.0000 mL | Freq: Once | INTRAMUSCULAR | Status: AC
  Administered 2015-01-03: 10 mL
  Filled 2015-01-03: qty 20

## 2015-01-03 MED ORDER — ROCURONIUM BROMIDE 100 MG/10ML IV SOLN
INTRAVENOUS | Status: AC
  Filled 2015-01-03: qty 1

## 2015-01-03 MED ORDER — FENTANYL CITRATE (PF) 250 MCG/5ML IJ SOLN
INTRAMUSCULAR | Status: AC
  Filled 2015-01-03: qty 25

## 2015-01-03 MED ORDER — FENTANYL CITRATE (PF) 100 MCG/2ML IJ SOLN
25.0000 ug | INTRAMUSCULAR | Status: DC | PRN
  Administered 2015-01-03 (×2): 25 ug via INTRAVENOUS

## 2015-01-03 MED ORDER — FENTANYL CITRATE (PF) 100 MCG/2ML IJ SOLN
INTRAMUSCULAR | Status: DC | PRN
  Administered 2015-01-03: 100 ug via INTRAVENOUS
  Administered 2015-01-03 (×3): 50 ug via INTRAVENOUS

## 2015-01-03 MED ORDER — ROCURONIUM BROMIDE 100 MG/10ML IV SOLN
INTRAVENOUS | Status: DC | PRN
  Administered 2015-01-03: 20 mg via INTRAVENOUS
  Administered 2015-01-03: 40 mg via INTRAVENOUS
  Administered 2015-01-03: 20 mg via INTRAVENOUS

## 2015-01-03 MED ORDER — ACETAMINOPHEN 10 MG/ML IV SOLN
1000.0000 mg | Freq: Once | INTRAVENOUS | Status: AC
  Administered 2015-01-03: 1000 mg via INTRAVENOUS
  Filled 2015-01-03: qty 100

## 2015-01-03 MED ORDER — MEPERIDINE HCL 25 MG/ML IJ SOLN: 6.2500 mg | INTRAMUSCULAR | Status: DC | PRN

## 2015-01-03 MED ORDER — DEXAMETHASONE SODIUM PHOSPHATE 10 MG/ML IJ SOLN
INTRAMUSCULAR | Status: DC | PRN
  Administered 2015-01-03: 4 mg via INTRAVENOUS

## 2015-01-03 SURGICAL SUPPLY — 61 items
APL SKNCLS STERI-STRIP NONHPOA (GAUZE/BANDAGES/DRESSINGS) ×2
BARRIER ADHS 3X4 INTERCEED (GAUZE/BANDAGES/DRESSINGS) ×3 IMPLANT
BENZOIN TINCTURE PRP APPL 2/3 (GAUZE/BANDAGES/DRESSINGS) ×3 IMPLANT
BRR ADH 4X3 ABS CNTRL BYND (GAUZE/BANDAGES/DRESSINGS) ×2
CHLORAPREP W/TINT 26ML (MISCELLANEOUS) ×3 IMPLANT
CLOTH BEACON ORANGE TIMEOUT ST (SAFETY) ×3 IMPLANT
CONT PATH 16OZ SNAP LID 3702 (MISCELLANEOUS) ×3 IMPLANT
COVER BACK TABLE 60X90IN (DRAPES) ×6 IMPLANT
COVER TIP SHEARS 8 DVNC (MISCELLANEOUS) ×2 IMPLANT
COVER TIP SHEARS 8MM DA VINCI (MISCELLANEOUS) ×1
DECANTER SPIKE VIAL GLASS SM (MISCELLANEOUS) ×3 IMPLANT
DRAPE WARM FLUID 44X44 (DRAPE) ×3 IMPLANT
DRSG COVADERM PLUS 2X2 (GAUZE/BANDAGES/DRESSINGS) ×12 IMPLANT
DRSG OPSITE POSTOP 3X4 (GAUZE/BANDAGES/DRESSINGS) ×3 IMPLANT
ELECT REM PT RETURN 9FT ADLT (ELECTROSURGICAL) ×3
ELECTRODE REM PT RTRN 9FT ADLT (ELECTROSURGICAL) ×2 IMPLANT
GAUZE VASELINE 3X9 (GAUZE/BANDAGES/DRESSINGS) IMPLANT
GLOVE BIO SURGEON STRL SZ 6.5 (GLOVE) ×3 IMPLANT
GLOVE BIO SURGEON STRL SZ7 (GLOVE) ×6 IMPLANT
GLOVE BIOGEL PI IND STRL 6.5 (GLOVE) ×2 IMPLANT
GLOVE BIOGEL PI INDICATOR 6.5 (GLOVE) ×1
GLOVE ECLIPSE 6.5 STRL STRAW (GLOVE) ×9 IMPLANT
KIT ACCESSORY DA VINCI DISP (KITS) ×1
KIT ACCESSORY DVNC DISP (KITS) ×2 IMPLANT
LEGGING LITHOTOMY PAIR STRL (DRAPES) ×3 IMPLANT
LIQUID BAND (GAUZE/BANDAGES/DRESSINGS) ×3 IMPLANT
MANIPULATOR UTERINE 4.5 ZUMI (MISCELLANEOUS) ×2 IMPLANT
NEEDLE INSUFFLATION 120MM (ENDOMECHANICALS) ×3 IMPLANT
NEEDLE INSUFFLATION 150MM (ENDOMECHANICALS) ×2 IMPLANT
NS IRRIG 1000ML POUR BTL (IV SOLUTION) ×9 IMPLANT
OCCLUDER COLPOPNEUMO (BALLOONS) ×6 IMPLANT
PACK ROBOT WH (CUSTOM PROCEDURE TRAY) ×3 IMPLANT
PACK ROBOTIC GOWN (GOWN DISPOSABLE) ×3 IMPLANT
PAD POSITIONING PINK XL (MISCELLANEOUS) ×3 IMPLANT
PAD PREP 24X48 CUFFED NSTRL (MISCELLANEOUS) ×6 IMPLANT
POUCH ENDO CATCH II 15MM (MISCELLANEOUS) ×2 IMPLANT
SET CYSTO W/LG BORE CLAMP LF (SET/KITS/TRAYS/PACK) ×2 IMPLANT
SET IRRIG TUBING LAPAROSCOPIC (IRRIGATION / IRRIGATOR) ×3 IMPLANT
SET TRI-LUMEN FLTR TB AIRSEAL (TUBING) ×2 IMPLANT
STRIP CLOSURE SKIN 1/2X4 (GAUZE/BANDAGES/DRESSINGS) ×3 IMPLANT
SUT VIC AB 0 CT1 27 (SUTURE) ×15
SUT VIC AB 0 CT1 27XBRD ANBCTR (SUTURE) ×10 IMPLANT
SUT VIC AB 2-0 CT2 27 (SUTURE) ×6 IMPLANT
SUT VICRYL 0 UR6 27IN ABS (SUTURE) ×6 IMPLANT
SUT VICRYL RAPIDE 3 0 (SUTURE) ×6 IMPLANT
SYR 50ML LL SCALE MARK (SYRINGE) ×3 IMPLANT
SYSTEM CONVERTIBLE TROCAR (TROCAR) IMPLANT
TIP UTERINE 5.1X6CM LAV DISP (MISCELLANEOUS) IMPLANT
TIP UTERINE 6.7X10CM GRN DISP (MISCELLANEOUS) IMPLANT
TIP UTERINE 6.7X6CM WHT DISP (MISCELLANEOUS) IMPLANT
TIP UTERINE 6.7X8CM BLUE DISP (MISCELLANEOUS) IMPLANT
TOWEL OR 17X24 6PK STRL BLUE (TOWEL DISPOSABLE) ×6 IMPLANT
TRAY FOLEY CATH SILVER 14FR (SET/KITS/TRAYS/PACK) ×3 IMPLANT
TROCAR BLADELESS 15MM (ENDOMECHANICALS) ×2 IMPLANT
TROCAR DILATING TIP 12MM 150MM (ENDOMECHANICALS) ×2 IMPLANT
TROCAR DISP BLADELESS 8 DVNC (TROCAR) ×2 IMPLANT
TROCAR DISP BLADELESS 8MM (TROCAR) ×1
TROCAR OPTI TIP 12M 100M (ENDOMECHANICALS) IMPLANT
TROCAR PORT AIRSEAL 5X120 (TROCAR) ×2 IMPLANT
TROCAR XCEL 12X100 BLDLESS (ENDOMECHANICALS) ×3 IMPLANT
TROCAR XCEL NON-BLD 5MMX100MML (ENDOMECHANICALS) ×5 IMPLANT

## 2015-01-03 NOTE — Discharge Instructions (Signed)

## 2015-01-03 NOTE — Anesthesia Procedure Notes (Signed)
Procedure Name: Intubation Date/Time: 01/03/2015 7:28 AM Performed by: Hewitt Blade Pre-anesthesia Checklist: Patient identified, Emergency Drugs available, Suction available and Patient being monitored Patient Re-evaluated:Patient Re-evaluated prior to inductionOxygen Delivery Method: Circle system utilized Preoxygenation: Pre-oxygenation with 100% oxygen Intubation Type: IV induction Ventilation: Mask ventilation without difficulty Number of attempts: 1 Airway Equipment and Method: Stylet Placement Confirmation: ETT inserted through vocal cords under direct vision,  positive ETCO2 and breath sounds checked- equal and bilateral Secured at: 21 cm Tube secured with: Tape Dental Injury: Teeth and Oropharynx as per pre-operative assessment

## 2015-01-03 NOTE — Anesthesia Postprocedure Evaluation (Signed)
  Anesthesia Post-op Note  Patient: Candace Sanders  Procedure(s) Performed: Procedure(s): ROBOTIC ASSISTED  RIGHT OVARIAN CYSTECTOMY AND   PARTIALRIGHT SALPINGECTOMY (N/A)  Patient Location: PACU  Anesthesia Type:General  Level of Consciousness: awake and alert   Airway and Oxygen Therapy: Patient Spontanous Breathing and Patient connected to nasal cannula oxygen  Post-op Pain: none  Post-op Assessment: Post-op Vital signs reviewed, Patient's Cardiovascular Status Stable, Respiratory Function Stable, Patent Airway and No signs of Nausea or vomiting              Post-op Vital Signs: Reviewed and stable  Last Vitals:  Filed Vitals:   01/03/15 1045  BP: 145/85  Pulse: 67  Temp:   Resp: 18    Complications: No apparent anesthesia complications and Patient re-intubated

## 2015-01-03 NOTE — Op Note (Signed)
01/03/2015  10:01 AM  PATIENT:  Candace Sanders  29 y.o. female  PRE-OPERATIVE DIAGNOSIS:  R OVARIAN CYST  POST-OPERATIVE DIAGNOSIS: R ovarian cyst  PROCEDURE:  Procedure(s): ROBOTIC ASSISTED  RIGHT OVARIAN CYSTECTOMY AND   PARTIALRIGHT SALPINGECTOMY (N/A)  SURGEON:  Surgeon(s) and Role:    * Bobbye Charleston, MD - Primary    * Jerelyn Charles, MD - Assisting  ANESTHESIA:   general  EBL:  Total I/O In: 1300 [I.V.:1300] Out: 350 [Urine:300; Blood:50]  LOCAL MEDICATIONS USED:  BUPIVICAINE   SPECIMEN:  Source of Specimen:  R ovarian cyst, portion of R tube and piece of omentum  DISPOSITION OF SPECIMEN:  PATHOLOGY  COUNTS:  YES  TOURNIQUET:  * No tourniquets in log *  DICTATION: .Note written in EPIC  PLAN OF CARE: Discharge to home after PACU  PATIENT DISPOSITION:  PACU - hemodynamically stable.   Complications:  None.  Findings:  15 cm R Ovary with r tube stretched across the top of it and having no identifiable fimbriae.  Liver edge and cul de sac were otherwise normal.  The ureters were seen well out of all fields of dissection.  Uterus and L Ovary and tube appeared completely normal.  Small abrasion of R fundus of the uterus caused by the hot shears was hemostatic.   Meds: exparel  Technique:  After adequate general anesthesia was achieved, the patient was prepped and draped in sterile fashion.  The speculum was placed into the vagina and a zumi uterine manipulator placed in the cervix. The speculum was removed and a catheter placed to drain the bladder during the surgery.  Attention was turned to the abdomen and a  2 cm incision was made above the umbilicus.  The veress needle passed into the abdomen without aspiration of bowel contents or blood.  The 12 mm trocar for the camera port was introduced after insuflatation and the above findings noted.  Two 8.5 mm trocars were introduced 10 cm either side of the camera port under direct visualization.  The PK forceps were  introduced on arm 2 and the Hot Shears on arm 1.    The right ovarian cyst was about 15 cm and was stretching the R tube over it.  There was a significant portion of the R ovary that was normal in appearance with just a 2 cm bridge of ovary the ovarian cyst and this was incised with unipolar cautery.  The IP ligament was identified and cautery continued above this along the mesosalpinx to remove the cyst.  An attempt was made to trace the R tube to its fimbriated end but it could not be identified along the top of the cyst.  The decision was made to sacrifice this tube to prevent likelihood of ectopics in the future.  The ureter was seen well below th field of dissection.  The ovarian cyst was held in place with a grasper.  Hemostasis was assured with the insufflation down.  The Robot was then undocked.  The 12 mm trocar was replaced with a 15 mm trocar and the large endobag placed in it.  The 8.5 mm scope was introduced and the ovary and tube were placed inside an endobag.  The bag was then brought through the umbilical incision and opened to the outside.  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  Fluid, ovary and tube were all successfully removed from the abdomen without any intraperitoneal spill and  sent to pathology.  The pedicle was inspected and found to be hemostatic.  The cyst was inspected and there were some small calcified excresences on the inside but otherwise normal smooth cyst wall.    All instruments were removed and the abdomen desuflated.  The 15 mm trocar site fascia was closed with a running stitch of 2-0 vicryl but the stitch was accidentally cut before a knot could be tied.  Two additional figure of eight stitches of 2-Vicryl ensured that the fascia was closed and did not open with digital exploration.  All skin incisions were closed with subcuticular stitches and Dermabond.  Exparel was injected into the incisions.  All  instruments were withdrawn from the vagina.  Pt tolerated the procedure well and was returned to the recovery room in stable condition.    Zong Mcquarrie A

## 2015-01-03 NOTE — Transfer of Care (Signed)
Immediate Anesthesia Transfer of Care Note  Patient: Candace Sanders  Procedure(s) Performed: Procedure(s): ROBOTIC ASSISTED  RIGHT OVARIAN CYSTECTOMY AND   PARTIALRIGHT SALPINGECTOMY (N/A)  Patient Location: PACU  Anesthesia Type:General  Level of Consciousness: awake, alert  and oriented  Airway & Oxygen Therapy: Patient Spontanous Breathing and Patient connected to nasal cannula oxygen  Post-op Assessment: Report given to RN, Post -op Vital signs reviewed and stable and Patient moving all extremities  Post vital signs: Reviewed and stable  Last Vitals:  Filed Vitals:   01/03/15 0603  BP: 145/87  Pulse: 85  Temp: 37 C  Resp: 20    Complications: No apparent anesthesia complications

## 2015-01-03 NOTE — Brief Op Note (Signed)
01/03/2015  10:01 AM  PATIENT:  Candace Sanders  29 y.o. female  PRE-OPERATIVE DIAGNOSIS:  R OVARIAN CYST  POST-OPERATIVE DIAGNOSIS: R ovarian cyst  PROCEDURE:  Procedure(s): ROBOTIC ASSISTED  RIGHT OVARIAN CYSTECTOMY AND   PARTIALRIGHT SALPINGECTOMY (N/A)  SURGEON:  Surgeon(s) and Role:    * Bobbye Charleston, MD - Primary    * Jerelyn Charles, MD - Assisting  ANESTHESIA:   general  EBL:  Total I/O In: 1300 [I.V.:1300] Out: 350 [Urine:300; Blood:50]  LOCAL MEDICATIONS USED:  BUPIVICAINE   SPECIMEN:  Source of Specimen:  R ovarian cyst, portion of R tube and piece of omentum  DISPOSITION OF SPECIMEN:  PATHOLOGY  COUNTS:  YES  TOURNIQUET:  * No tourniquets in log *  DICTATION: .Note written in EPIC  PLAN OF CARE: Discharge to home after PACU  PATIENT DISPOSITION:  PACU - hemodynamically stable.   Complications:  None.  Findings:  15 cm R Ovary with r tube stretched across the top of it and having no identifiable fimbriae.  Liver edge and cul de sac were otherwise normal.  The ureters were seen well out of all fields of dissection.  Uterus and L Ovary and tube appeared completely normal.  Small abrasion of R fundus of the uterus caused by the hot shears was hemostatic.   Meds: exparel  Technique:  After adequate general anesthesia was achieved, the patient was prepped and draped in sterile fashion.  The speculum was placed into the vagina and a zumi uterine manipulator placed in the cervix. The speculum was removed and a catheter placed to drain the bladder during the surgery.  Attention was turned to the abdomen and a  2 cm incision was made above the umbilicus.  The veress needle passed into the abdomen without aspiration of bowel contents or blood.  The 12 mm trocar for the camera port was introduced after insuflatation and the above findings noted.  Two 8.5 mm trocars were introduced 10 cm either side of the camera port under direct visualization.  The PK forceps were  introduced on arm 2 and the Hot Shears on arm 1.    The right ovarian cyst was about 15 cm and was stretching the R tube over it.  There was a significant portion of the R ovary that was normal in appearance with just a 2 cm bridge of ovary the ovarian cyst and this was incised with unipolar cautery.  The IP ligament was identified and cautery continued above this along the mesosalpinx to remove the cyst.  An attempt was made to trace the R tube to its fimbriated end but it could not be identified along the top of the cyst.  The decision was made to sacrifice this tube to prevent likelihood of ectopics in the future.  The ureter was seen well below th field of dissection.  The ovarian cyst was held in place with a grasper.  Hemostasis was assured with the insufflation down.  The Robot was then undocked.  The 12 mm trocar was replaced with a 15 mm trocar and the large endobag placed in it.  The 8.5 mm scope was introduced and the ovary and tube were placed inside an endobag.  The bag was then brought through the umbilical incision and opened to the outside.  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  Fluid, ovary and tube were all successfully removed from the abdomen without any intraperitoneal spill and  sent to pathology.  The pedicle was inspected and found to be hemostatic.  The cyst was inspected and there were some small calcified excresences on the inside but otherwise normal smooth cyst wall.    All instruments were removed and the abdomen desuflated.  The 15 mm trocar site fascia was closed with a running stitch of 2-0 vicryl but the stitch was accidentally cut before a knot could be tied.  Two additional figure of eight stitches of 2-Vicryl ensured that the fascia was closed and did not open with digital exploration.  All skin incisions were closed with subcuticular stitches and Dermabond.  Exparel was injected into the incisions.  All  instruments were withdrawn from the vagina.  Pt tolerated the procedure well and was returned to the recovery room in stable condition.    Emmogene Simson A

## 2015-01-03 NOTE — Progress Notes (Signed)
There has been no change in the patients history, status or exam since the history and physical.  Filed Vitals:   01/03/15 0603  BP: 145/87  Pulse: 85  Temp: 98.6 F (37 C)  TempSrc: Oral  Resp: 20  SpO2: 100%    Lab Results  Component Value Date   WBC 9.4 12/26/2014   HGB 12.4 12/26/2014   HCT 37.0 12/26/2014   MCV 77.7* 12/26/2014   PLT 318 12/26/2014    Ernan Runkles A

## 2015-01-04 ENCOUNTER — Encounter (HOSPITAL_COMMUNITY): Payer: Self-pay | Admitting: Obstetrics and Gynecology

## 2015-03-27 ENCOUNTER — Other Ambulatory Visit: Payer: Self-pay | Admitting: Obstetrics and Gynecology

## 2015-04-25 ENCOUNTER — Encounter: Payer: Self-pay | Admitting: *Deleted

## 2015-04-25 ENCOUNTER — Emergency Department (INDEPENDENT_AMBULATORY_CARE_PROVIDER_SITE_OTHER)
Admission: EM | Admit: 2015-04-25 | Discharge: 2015-04-25 | Disposition: A | Discharge status: Home / Self Care | Payer: BLUE CROSS/BLUE SHIELD | Source: Home / Self Care | Attending: Family Medicine | Admitting: Family Medicine

## 2015-04-25 DIAGNOSIS — B9789 Other viral agents as the cause of diseases classified elsewhere: Principal | ICD-10-CM

## 2015-04-25 DIAGNOSIS — J069 Acute upper respiratory infection, unspecified: Secondary | ICD-10-CM

## 2015-04-25 MED ORDER — PREDNISONE 20 MG PO TABS: 20.0000 mg | ORAL_TABLET | Freq: Two times a day (BID) | ORAL | Status: DC

## 2015-04-25 MED ORDER — AZITHROMYCIN 250 MG PO TABS: ORAL_TABLET | ORAL | Status: DC

## 2015-04-25 MED ORDER — BENZONATATE 200 MG PO CAPS: 200.0000 mg | ORAL_CAPSULE | Freq: Every day | ORAL | Status: DC

## 2015-04-25 NOTE — ED Notes (Signed)
Pt c/o sinus pain and congestion, non-productive cough since last night. Reports "not feeling well" last week but symptoms really started last night.

## 2015-04-25 NOTE — Discharge Instructions (Signed)
Take plain guaifenesin (1200mg extended release tabs such as Mucinex) twice daily, with plenty of water, for cough and congestion.  May add Pseudoephedrine (30mg, one or two every 4 to 6 hours) for sinus congestion.  Get adequate rest.   °May use Afrin nasal spray (or generic oxymetazoline) twice daily for about 5 days and then discontinue.  Also recommend using saline nasal spray several times daily and saline nasal irrigation (AYR is a common brand).  Use Flonase nasal spray each morning after using Afrin nasal spray and saline nasal irrigation. °Try warm salt water gargles for sore throat.  °Stop all antihistamines for now, and other non-prescription cough/cold preparations. °Begin Azithromycin if not improving about one week or if persistent fever develops   °Follow-up with family doctor if not improving about10 days.  °

## 2015-04-25 NOTE — ED Provider Notes (Signed)
CSN: [id number]     Arrival date & time 04/25/15  1113 History   First MD Initiated Contact with Patient 04/25/15 1136     Chief Complaint  Patient presents with  . Sinus Problem      HPI Comments: Patient states that she developed a mild non-productive cough about one week ago. She then developed cold-like symptoms including mild sore throat, sinus congestion, chills, headache, and fatigue.   She has a past history of exercise asthma.  The history is provided by the patient.    Past Medical History  Diagnosis Date  . Hypertension     no meds  . Shortness of breath dyspnea   . Asthma     childhood  . Depression   . Anxiety   . PONV (postoperative nausea and vomiting)     nausea   Past Surgical History  Procedure Laterality Date  . Tonsillectomy    . Adenoidectomy    . Robotic assisted laparoscopic partial cystectomy N/A 01/03/2015    Procedure: ROBOTIC ASSISTED  RIGHT OVARIAN CYSTECTOMY AND   PARTIALRIGHT SALPINGECTOMY;  Surgeon: Bobbye Charleston, MD;  Location: [facility];  Service: Gynecology;  Laterality: N/A;   Family History  Problem Relation Age of Onset  . Hyperlipidemia Mother   . Hypertension Father    Social History  Substance Use Topics  . Smoking status: Former Smoker    Quit date: 10/30/2010  . Smokeless tobacco: Current User     Comment: vape  . Alcohol Use: Yes     Comment: socially   OB History    No data available     Review of Systems + sore throat + hoarse + cough No pleuritic pain No wheezing + nasal congestion + post-nasal drainage No sinus pain/pressure No itchy/red eyes No earache No hemoptysis No SOB No fever, + chills No nausea No vomiting No abdominal pain No diarrhea No urinary symptoms No skin rash + fatigue + myalgias + headache Used OTC meds without relief  Allergies  Review of patient's allergies indicates no known allergies.  Home Medications   Prior to Admission medications   Medication Sig Start Date End  Date Taking? Authorizing Provider  azithromycin (ZITHROMAX Z-PAK) 250 MG tablet Take 2 tabs today; then begin one tab once daily for 4 more days. (Rx void after 05/03/15) 04/25/15   Kandra Nicolas, MD  benzonatate (TESSALON) 200 MG capsule Take 1 capsule (200 mg total) by mouth at bedtime. Take as needed for cough 04/25/15   Kandra Nicolas, MD  Cetirizine HCl (ZYRTEC ALLERGY PO) Take 1 tablet by mouth daily as needed (seasonal allergies and dust/mold allergy).    Historical Provider, MD  norgestimate-ethinyl estradiol ([place] 28) 0.25-35 MG-MCG tablet Take 1 tablet by mouth daily.    Historical Provider, MD  predniSONE (DELTASONE) 20 MG tablet Take 1 tablet (20 mg total) by mouth 2 (two) times daily. Take with food. 04/25/15   Kandra Nicolas, MD   Meds Ordered and Administered this Visit  Medications - No data to display  BP 149/85 mmHg  Pulse 88  Temp(Src) 98.1 F (36.7 C) (Oral)  Resp 18  Wt 357 lb (161.934 kg)  SpO2 99%  LMP 04/04/2015 No data found.   Physical Exam Nursing notes and Vital Signs reviewed. Appearance:  Patient appears stated age, and in no acute distress.  Patient is obese  Eyes:  Pupils are equal, round, and reactive to light and accomodation.  Extraocular movement is intact.  Conjunctivae are  not inflamed  Ears:  Canals normal.  Tympanic membranes normal.  Nose:  Mildly congested turbinates.  No sinus tenderness.   Pharynx:  Normal Neck:  Supple.  Tender enlarged posterior nodes are palpated bilaterally  Lungs:  Clear to auscultation.  Breath sounds are equal.  Moving air well. Heart:  Regular rate and rhythm without murmurs, rubs, or gallops.  Abdomen:  Nontender without masses or hepatosplenomegaly.  Bowel sounds are present.  No CVA or flank tenderness.  Extremities:  No edema.    Skin:  No rash present.   ED Course  Procedures  None    MDM   1. Viral URI with cough    There is no evidence of bacterial infection today.  With a history of exercise  asthma, will start prednisone burst.  Prescription written for Benzonatate (Tessalon) to take at bedtime for night-time cough.  Take plain guaifenesin (1200mg  extended release tabs such as Mucinex) twice daily, with plenty of water, for cough and congestion.  May add Pseudoephedrine (30mg , one or two every 4 to 6 hours) for sinus congestion.  Get adequate rest.   May use Afrin nasal spray (or generic oxymetazoline) twice daily for about 5 days and then discontinue.  Also recommend using saline nasal spray several times daily and saline nasal irrigation (AYR is a common brand).  Use Flonase nasal spray each morning after using Afrin nasal spray and saline nasal irrigation. Try warm salt water gargles for sore throat.  Stop all antihistamines for now, and other non-prescription cough/cold preparations. Begin Azithromycin if not improving about one week or if persistent fever develops (Given a prescription to hold, with an expiration date)  Follow-up with family doctor if not improving about10 days.     Kandra Nicolas, MD 04/25/15 [phone]

## 2015-06-06 ENCOUNTER — Encounter: Payer: Self-pay | Admitting: *Deleted

## 2015-06-06 ENCOUNTER — Emergency Department (INDEPENDENT_AMBULATORY_CARE_PROVIDER_SITE_OTHER)
Admission: EM | Admit: 2015-06-06 | Discharge: 2015-06-06 | Disposition: A | Discharge status: Home / Self Care | Payer: BLUE CROSS/BLUE SHIELD | Source: Home / Self Care | Attending: Family Medicine | Admitting: Family Medicine

## 2015-06-06 DIAGNOSIS — R110 Nausea: Secondary | ICD-10-CM

## 2015-06-06 DIAGNOSIS — A09 Infectious gastroenteritis and colitis, unspecified: Secondary | ICD-10-CM | POA: Diagnosis not present

## 2015-06-06 DIAGNOSIS — R197 Diarrhea, unspecified: Secondary | ICD-10-CM

## 2015-06-06 MED ORDER — ONDANSETRON 4 MG PO TBDP: ORAL_TABLET | ORAL | Status: DC

## 2015-06-06 NOTE — ED Provider Notes (Signed)
CSN: [id number]     Arrival date & time 06/06/15  1529 History   First MD Initiated Contact with Patient 06/06/15 1542     Chief Complaint  Patient presents with  . Diarrhea      HPI Comments: Patient awoke with nausea/vomiting two days ago, followed by watery diarrhea.  No vomiting has ceased but diarrhea has persisted.  Her mother and father have similar symptoms.  Denies recent foreign travel, or drinking untreated water in a wilderness environment.  She denies recent antibiotic use.   Patient is a 30 y.o. female presenting with vomiting.  Emesis Severity:  Mild Duration:  1 day Timing: resolved. Quality:  Stomach contents Able to tolerate:  Liquids Progression:  Resolved Chronicity:  New Recent urination:  Normal Relieved by:  Nothing Worsened by:  Nothing tried Ineffective treatments:  None tried Associated symptoms: abdominal pain, chills, diarrhea, headaches and myalgias   Associated symptoms: no arthralgias, no cough, no fever, no sore throat and no URI   Diarrhea:    Quality:  Watery   Number of occurrences:  5 per day   Severity:  Moderate   Duration:  3 days   Timing:  Intermittent   Progression:  Unchanged Risk factors: sick contacts   Risk factors: no suspect food intake and no travel to endemic areas     Past Medical History  Diagnosis Date  . Hypertension     no meds  . Shortness of breath dyspnea   . Asthma     childhood  . Depression   . Anxiety   . PONV (postoperative nausea and vomiting)     nausea   Past Surgical History  Procedure Laterality Date  . Tonsillectomy    . Adenoidectomy    . Robotic assisted laparoscopic partial cystectomy N/A 01/03/2015    Procedure: ROBOTIC ASSISTED  RIGHT OVARIAN CYSTECTOMY AND   PARTIALRIGHT SALPINGECTOMY;  Surgeon: Bobbye Charleston, MD;  Location: [facility];  Service: Gynecology;  Laterality: N/A;   Family History  Problem Relation Age of Onset  . Hyperlipidemia Mother   . Hypertension Father    Social  History  Substance Use Topics  . Smoking status: Former Smoker    Quit date: 10/30/2010  . Smokeless tobacco: Current User     Comment: vape  . Alcohol Use: Yes     Comment: socially   OB History    No data available     Review of Systems  Constitutional: Positive for chills.  HENT: Negative for sore throat.   Gastrointestinal: Positive for vomiting, abdominal pain and diarrhea.  Musculoskeletal: Positive for myalgias. Negative for arthralgias.  Neurological: Positive for headaches.  All other systems reviewed and are negative.   Allergies  Review of patient's allergies indicates no known allergies.  Home Medications   Prior to Admission medications   Medication Sig Start Date End Date Taking? Authorizing Provider  Cetirizine HCl (ZYRTEC ALLERGY PO) Take 1 tablet by mouth daily as needed (seasonal allergies and dust/mold allergy).    Historical Provider, MD  norgestimate-ethinyl estradiol ([place]wing 28) 0.25-35 MG-MCG tablet Take 1 tablet by mouth daily.    Historical Provider, MD   Meds Ordered and Administered this Visit  Medications - No data to display  BP 142/88 mmHg  Pulse 92  Temp(Src) 98.3 F (36.8 C) (Oral)  Resp 16  Ht 5\' 7"  (1.702 m)  Wt 350 lb (158.759 kg)  BMI 54.80 kg/m2  SpO2 100% No data found.   Physical Exam Nursing notes  and Vital Signs reviewed. Appearance:  Patient appears stated age, and in no acute distress.  Patient is obese (BMI 54.8) Eyes:  Pupils are equal, round, and reactive to light and accomodation.  Extraocular movement is intact.  Conjunctivae are not inflamed  Pharynx:  Normal; moist mucous membranes  Neck:  Supple.   No adenopathy Lungs:  Clear to auscultation.  Breath sounds are equal.  Moving air well. Heart:  Regular rate and rhythm without murmurs, rubs, or gallops.  Abdomen:   Mild diffuse tenderness without masses or hepatosplenomegaly.  Bowel sounds are present and increased.  No CVA or flank tenderness.  Extremities:   No edema.  Skin:  No rash present.   ED Course  Procedures  None   MDM   1. Nausea without vomiting; suspect viral gastroenteritis   2. Diarrhea of presumed infectious origin    Rx for Zofran  Begin clear liquids (Pedialyte while having diarrhea) until improved, then advance to a [employer] (Bananas, Rice, Applesauce, Toast).  Then gradually resume a regular diet when tolerated.  Avoid milk products until well.    When stools become more formed, may take Imodium (loperamide) once or twice daily to decrease stool frequency.  If symptoms become significantly worse during the night or over the weekend, proceed to the local emergency room.  Followup with Family Doctor if not improved in five days.    Kandra Nicolas, MD 06/06/15 [phone]

## 2015-06-06 NOTE — ED Notes (Signed)
Pt c/o vomiting and diarrhea 2 days ago, the vomiting resolved the same day. She reports continued diarrhea, chills, and frequent urination. Denies fever or dysuria.

## 2015-06-06 NOTE — Discharge Instructions (Signed)
Begin clear liquids (Pedialyte while having diarrhea) until improved, then advance to a BRAT diet (Bananas, Rice, Applesauce, Toast).  Then gradually resume a regular diet when tolerated.  Avoid milk products until well.  When stools become more formed, may take Imodium (loperamide) once or twice daily to decrease stool frequency.  °If symptoms become significantly worse during the night or over the weekend, proceed to the local emergency room.  °

## 2015-06-09 ENCOUNTER — Telehealth: Payer: Self-pay

## 2016-05-22 ENCOUNTER — Other Ambulatory Visit: Payer: Self-pay | Admitting: Obstetrics and Gynecology

## 2016-05-25 ENCOUNTER — Emergency Department (INDEPENDENT_AMBULATORY_CARE_PROVIDER_SITE_OTHER)
Admission: EM | Admit: 2016-05-25 | Discharge: 2016-05-25 | Disposition: A | Discharge status: Home / Self Care | Payer: BLUE CROSS/BLUE SHIELD | Source: Home / Self Care | Attending: Family Medicine | Admitting: Family Medicine

## 2016-05-25 DIAGNOSIS — J019 Acute sinusitis, unspecified: Secondary | ICD-10-CM | POA: Diagnosis not present

## 2016-05-25 LAB — CYTOLOGY - PAP

## 2016-05-25 MED ORDER — AMOXICILLIN-POT CLAVULANATE 875-125 MG PO TABS: 1.0000 | ORAL_TABLET | Freq: Two times a day (BID) | ORAL | 0 refills | Status: DC

## 2016-05-25 NOTE — ED Provider Notes (Signed)
CSN: [id number]     Arrival date & time 05/25/16  1322 History   First MD Initiated Contact with Patient 05/25/16 1402     No chief complaint on file.  (Consider location/radiation/quality/duration/timing/severity/associated sxs/prior Treatment) HPI  Candace Sanders is a 31 y.o. female presenting to UC with c/o 1 week of worsening sinus congestion with pain and pressure.  Associated sore throat, body aches, chills and sweats last night.  She has taken mucinex but believes she did not take enough to prevent current symptoms. Hx of sinus infections around same time every year. No known sick contacts. Minimal cough.   Past Medical History:  Diagnosis Date  . Anxiety   . Asthma    childhood  . Depression   . Hypertension    no meds  . PONV (postoperative nausea and vomiting)    nausea  . Shortness of breath dyspnea    Past Surgical History:  Procedure Laterality Date  . ADENOIDECTOMY    . ROBOTIC ASSISTED LAPAROSCOPIC PARTIAL CYSTECTOMY N/A 01/03/2015   Procedure: ROBOTIC ASSISTED  RIGHT OVARIAN CYSTECTOMY AND   PARTIALRIGHT SALPINGECTOMY;  Surgeon: Bobbye Charleston, MD;  Location: [facility];  Service: Gynecology;  Laterality: N/A;  . TONSILLECTOMY     Family History  Problem Relation Age of Onset  . Hyperlipidemia Mother   . Hypertension Father    Social History  Substance Use Topics  . Smoking status: Former Smoker    Quit date: 10/30/2010  . Smokeless tobacco: Current User     Comment: vape  . Alcohol use Yes     Comment: socially   OB History    No data available     Review of Systems  Constitutional: Positive for chills, fatigue and fever ( subjective).  HENT: Positive for congestion, ear pain ( itchy and sore), postnasal drip, rhinorrhea, sinus pain, sinus pressure and sore throat. Negative for trouble swallowing and voice change.   Respiratory: Positive for cough ( minimal). Negative for shortness of breath.   Cardiovascular: Negative for chest pain and palpitations.   Gastrointestinal: Negative for abdominal pain, diarrhea, nausea and vomiting.  Musculoskeletal: Positive for arthralgias and myalgias. Negative for back pain.  Skin: Negative for rash.  Neurological: Positive for headaches. Negative for dizziness and light-headedness.    Allergies  Patient has no known allergies.  Home Medications   Prior to Admission medications   Medication Sig Start Date End Date Taking? Authorizing Provider  norgestimate-ethinyl estradiol (SPRINTEC 28) 0.25-35 MG-MCG tablet Take 1 tablet by mouth daily.   Yes Historical Provider, MD  amoxicillin-clavulanate (AUGMENTIN) 875-125 MG tablet Take 1 tablet by mouth 2 (two) times daily. One po bid x 7 days 05/25/16   Noland Fordyce, PA-C   Meds Ordered and Administered this Visit  Medications - No data to display  There were no vitals taken for this visit. No data found.   Physical Exam  Constitutional: She appears well-developed and well-nourished. No distress.  HENT:  Head: Normocephalic and atraumatic.  Right Ear: Tympanic membrane normal.  Left Ear: Tympanic membrane normal.  Nose: Mucosal edema present. Right sinus exhibits maxillary sinus tenderness and frontal sinus tenderness. Left sinus exhibits maxillary sinus tenderness and frontal sinus tenderness.  Mouth/Throat: Uvula is midline, oropharynx is clear and moist and mucous membranes are normal.  Eyes: Conjunctivae are normal. No scleral icterus.  Neck: Normal range of motion. Neck supple.  Cardiovascular: Normal rate, regular rhythm and normal heart sounds.   Pulmonary/Chest: Effort normal and breath sounds normal. No  stridor. No respiratory distress. She has no wheezes. She has no rales.  Musculoskeletal: Normal range of motion.  Lymphadenopathy:    She has no cervical adenopathy.  Neurological: She is alert.  Skin: Skin is warm and dry. She is not diaphoretic.  Nursing note and vitals reviewed.   Urgent Care Course   Clinical Course      Procedures (including critical care time)  Labs Review Labs Reviewed - No data to display  Imaging Review No results found.    MDM   1. Acute rhinosinusitis    Worsening sinus symptoms for about 1 week. Sinus tenderness noted on exam. Reported sweats and chills last night. Subjective fever.  Rx: Augmentin  F/u with PCP in 1 week if not improving.     Noland Fordyce, PA-C 05/25/16 1433

## 2016-05-30 ENCOUNTER — Telehealth: Payer: Self-pay | Admitting: Emergency Medicine

## 2016-05-30 NOTE — Telephone Encounter (Signed)
Patient states she is feeling better and only has one more day of antibiotics.

## 2017-01-06 ENCOUNTER — Emergency Department (INDEPENDENT_AMBULATORY_CARE_PROVIDER_SITE_OTHER)
Admission: EM | Admit: 2017-01-06 | Discharge: 2017-01-06 | Disposition: A | Discharge status: Home / Self Care | Payer: BLUE CROSS/BLUE SHIELD | Source: Home / Self Care | Attending: Family Medicine | Admitting: Family Medicine

## 2017-01-06 ENCOUNTER — Encounter: Payer: Self-pay | Admitting: *Deleted

## 2017-01-06 DIAGNOSIS — L232 Allergic contact dermatitis due to cosmetics: Secondary | ICD-10-CM | POA: Diagnosis not present

## 2017-01-06 MED ORDER — PREDNISONE 20 MG PO TABS: ORAL_TABLET | ORAL | 0 refills | Status: DC

## 2017-01-06 NOTE — Discharge Instructions (Signed)
Avoid applying lip balm or cosmetics to lips.  Use Vitamin E oil to moisturize lips.

## 2017-01-06 NOTE — ED Triage Notes (Signed)
Pt c/o dry, swollen and cracking lips x 8 days after applying a lip gloss that she does not normally wear. She has applied Vaseline and taken OTC allergy tab without relief.

## 2017-01-06 NOTE — ED Provider Notes (Signed)
[facility]    CSN: [id number] Arrival date & time: 01/06/17  1326     History   Chief Complaint Chief Complaint  Patient presents with  . Allergic Reaction    HPI Candace Sanders is a 31 y.o. female.   Patient reports that she developed swelling and itching of her lips about 8 days ago after using a new "lip gloss" that she had not worn before.  She has applied Vaseline and OTC allergy tabs without improvement.  She denies shortness of breath or wheezing, and feels well otherwise.   The history is provided by the patient.  Allergic Reaction  Presenting symptoms: itching and swelling   Presenting symptoms: no difficulty breathing, no difficulty swallowing, no rash and no wheezing   Severity:  Mild Duration:  8 days Prior allergic episodes:  No prior episodes Context: cosmetics   Relieved by:  Nothing Worsened by:  Nothing Ineffective treatments:  OTC ointments and antihistamines   Past Medical History:  Diagnosis Date  . Anxiety   . Asthma    childhood  . Depression   . Hypertension    no meds  . PONV (postoperative nausea and vomiting)    nausea  . Shortness of breath dyspnea     There are no active problems to display for this patient.   Past Surgical History:  Procedure Laterality Date  . ADENOIDECTOMY    . ROBOTIC ASSISTED LAPAROSCOPIC PARTIAL CYSTECTOMY N/A 01/03/2015   Procedure: ROBOTIC ASSISTED  RIGHT OVARIAN CYSTECTOMY AND   PARTIALRIGHT SALPINGECTOMY;  Surgeon: Bobbye Charleston, MD;  Location: [facility];  Service: Gynecology;  Laterality: N/A;  . TONSILLECTOMY      OB History    No data available       Home Medications    Prior to Admission medications   Medication Sig Start Date End Date Taking? Authorizing Provider  norgestimate-ethinyl estradiol (SPRINTEC 28) 0.25-35 MG-MCG tablet Take 1 tablet by mouth daily.    [provider]  predniSONE (DELTASONE) 20 MG tablet Take one tab by mouth twice daily for 4 days, then  one daily for 3 days. Take with food. 01/06/17   Kandra Nicolas, MD    Family History Family History  Problem Relation Age of Onset  . Hyperlipidemia Mother   . Hypertension Father     Social History Social History  Substance Use Topics  . Smoking status: Former Smoker    Quit date: 10/30/2010  . Smokeless tobacco: Current User     Comment: vape  . Alcohol use Yes     Comment: socially     Allergies   Patient has no known allergies.   Review of Systems Review of Systems  HENT: Negative for trouble swallowing.   Respiratory: Negative for wheezing.   Skin: Positive for itching. Negative for rash.  All other systems reviewed and are negative.    Physical Exam Triage Vital Signs ED Triage Vitals  Enc Vitals Group     BP 01/06/17 1331 (!) 135/93     Pulse Rate 01/06/17 1331 71     Resp 01/06/17 1331 18     Temp 01/06/17 1331 98.7 F (37.1 C)     Temp Source 01/06/17 1331 Oral     SpO2 01/06/17 1331 99 %     Weight --      Height --      Head Circumference --      Peak Flow --      Pain Score 01/06/17  1332 0     Pain Loc --      Pain Edu? --      Excl. in [place]? --    No data found.   Updated Vital Signs BP (!) 135/93 (BP Location: Left Arm)   Pulse 71   Temp 98.7 F (37.1 C) (Oral)   Resp 18   SpO2 99%   Visual Acuity Right Eye Distance:   Left Eye Distance:   Bilateral Distance:    Right Eye Near:   Left Eye Near:    Bilateral Near:     Physical Exam  Constitutional: She appears well-developed and well-nourished. No distress.  HENT:  Right Ear: External ear normal.  Left Ear: External ear normal.  Nose: Nose normal.  Mouth/Throat: Oropharynx is clear and moist. No oral lesions.    Mild swelling of lips without tenderness, erythema, or lesions.  Eyes: Pupils are equal, round, and reactive to light. Conjunctivae are normal.  Neck: Neck supple.  Cardiovascular: Normal heart sounds.   Pulmonary/Chest: Breath sounds normal.  Abdominal:  There is no tenderness.  Lymphadenopathy:    She has no cervical adenopathy.  Neurological: She is alert.  Skin: Skin is warm and dry.  Nursing note and vitals reviewed.    UC Treatments / Results  Labs (all labs ordered are listed, but only abnormal results are displayed) Labs Reviewed - No data to display  EKG  EKG Interpretation None       Radiology No results found.  Procedures Procedures (including critical care time)  Medications Ordered in UC Medications - No data to display   Initial Impression / Assessment and Plan / UC Course  I have reviewed the triage vital signs and the nursing notes.  Pertinent labs & imaging results that were available during my care of the patient were reviewed by me and considered in my medical decision making (see chart for details).    Begin prednisone burst/taper. Avoid applying lip balm or cosmetics to lips.  Use Vitamin E oil to moisturize lips. Followup with dermatologist if symptoms persist.    Final Clinical Impressions(s) / UC Diagnoses   Final diagnoses:  Allergic contact dermatitis due to cosmetics    New Prescriptions New Prescriptions   PREDNISONE (DELTASONE) 20 MG TABLET    Take one tab by mouth twice daily for 4 days, then one daily for 3 days. Take with food.         Kandra Nicolas, MD 01/13/17 [phone]

## 2019-10-18 ENCOUNTER — Other Ambulatory Visit: Payer: Self-pay

## 2019-10-18 ENCOUNTER — Ambulatory Visit
Admission: EM | Admit: 2019-10-18 | Discharge: 2019-10-18 | Disposition: A | Discharge status: Home / Self Care | Payer: BC Managed Care – PPO | Attending: Physician Assistant | Admitting: Physician Assistant

## 2019-10-18 DIAGNOSIS — I10 Essential (primary) hypertension: Secondary | ICD-10-CM | POA: Insufficient documentation

## 2019-10-18 DIAGNOSIS — R112 Nausea with vomiting, unspecified: Secondary | ICD-10-CM | POA: Diagnosis not present

## 2019-10-18 DIAGNOSIS — R1013 Epigastric pain: Secondary | ICD-10-CM | POA: Diagnosis not present

## 2019-10-18 DIAGNOSIS — Z87891 Personal history of nicotine dependence: Secondary | ICD-10-CM | POA: Insufficient documentation

## 2019-10-18 LAB — POCT URINALYSIS DIP (MANUAL ENTRY)
Bilirubin, UA: NEGATIVE
Glucose, UA: NEGATIVE mg/dL
Ketones, POC UA: NEGATIVE mg/dL
Leukocytes, UA: NEGATIVE
Nitrite, UA: NEGATIVE
Protein Ur, POC: NEGATIVE mg/dL
Spec Grav, UA: 1.015 (ref 1.010–1.025)
Urobilinogen, UA: 0.2 E.U./dL
pH, UA: 7 (ref 5.0–8.0)

## 2019-10-18 LAB — POCT URINE PREGNANCY: Preg Test, Ur: NEGATIVE

## 2019-10-18 MED ORDER — FAMOTIDINE 20 MG PO TABS: 20.0000 mg | ORAL_TABLET | Freq: Every day | ORAL | 0 refills | Status: AC

## 2019-10-18 MED ORDER — ONDANSETRON 4 MG PO TBDP: 4.0000 mg | ORAL_TABLET | Freq: Three times a day (TID) | ORAL | 0 refills | Status: AC | PRN

## 2019-10-18 NOTE — ED Triage Notes (Signed)
Pt states woke up yesterday morning feeling nausea, sweating and vomit x1. Pt states having discomfort feeling in urethra area. Denies UTI sx/s or vaginal discharge.

## 2019-10-18 NOTE — Discharge Instructions (Signed)
Zofran for nausea and vomiting as needed. Pepcid for possible acid reflux causing symptoms. Keep hydrated, you urine should be clear to pale yellow in color. Bland diet, advance as tolerated. Cytology sent, you will be contacted with any positive results that requires further treatment. Monitor for any worsening of symptoms, nausea or vomiting not controlled by medication, worsening abdominal pain, fever, go to the emergency department for further evaluation needed.

## 2019-10-18 NOTE — ED Provider Notes (Signed)
[facility]    CSN: [id number] Arrival date & time: 10/18/19  0820      History   Chief Complaint Chief Complaint  Patient presents with  . Nausea    HPI Candace Sanders is a 34 y.o. female.   34 year old female comes in for 2 day history of nausea, abdominal pain. Woke up yesterday with nausea and 1 NBNB emesis with sweating. Since then, has been able to tolerate oral intake. Still with intermittent nausea. States continues with dull/cramping pain to RUQ/LUQ without obvious aggravating or alleviating factor. Denies association with oral intake/movement. Denies diarrhea, fever, URI symptoms. Had some urine odor without frequency, dysuria, hematuria. States had some vaginal irritation few days prior to current symptom onset, but has resolved, no current vaginal symptoms. LMP 08/18/2019 on OCPs with cycle q59months. Sexually active with one female partner, consistent condom use.      Past Medical History:  Diagnosis Date  . Anxiety   . Asthma    childhood  . Depression   . Hypertension    no meds  . PONV (postoperative nausea and vomiting)    nausea  . Shortness of breath dyspnea     There are no problems to display for this patient.   Past Surgical History:  Procedure Laterality Date  . ADENOIDECTOMY    . ROBOTIC ASSISTED LAPAROSCOPIC PARTIAL CYSTECTOMY N/A 01/03/2015   Procedure: ROBOTIC ASSISTED  RIGHT OVARIAN CYSTECTOMY AND   PARTIALRIGHT SALPINGECTOMY;  Surgeon: Bobbye Charleston, MD;  Location: [facility];  Service: Gynecology;  Laterality: N/A;  . TONSILLECTOMY      OB History   No obstetric history on file.      Home Medications    Prior to Admission medications   Medication Sig Start Date End Date Taking? Authorizing Provider  famotidine (PEPCID) 20 MG tablet Take 1 tablet (20 mg total) by mouth daily. 10/18/19   Ok Edwards, PA-C  norgestimate-ethinyl estradiol (SPRINTEC 28) 0.25-35 MG-MCG tablet Take 1 tablet by mouth daily.    [provider]   ondansetron (ZOFRAN ODT) 4 MG disintegrating tablet Take 1 tablet (4 mg total) by mouth every 8 (eight) hours as needed for nausea or vomiting. 10/18/19   Ok Edwards, PA-C    Family History Family History  Problem Relation Age of Onset  . Hyperlipidemia Mother   . Hypertension Father     Social History Social History   Tobacco Use  . Smoking status: Former Smoker    Quit date: 10/30/2010    Years since quitting: 8.9  . Smokeless tobacco: Current User  . Tobacco comment: vape  Substance Use Topics  . Alcohol use: Yes    Comment: socially  . Drug use: Yes    Types: Marijuana     Allergies   Patient has no known allergies.   Review of Systems Review of Systems  Reason unable to perform ROS: See HPI as above.     Physical Exam Triage Vital Signs ED Triage Vitals  Enc Vitals Group     BP 10/18/19 0828 (!) 141/87     Pulse Rate 10/18/19 0828 79     Resp 10/18/19 0828 18     Temp 10/18/19 0828 98.2 F (36.8 C)     Temp Source 10/18/19 0828 Oral     SpO2 10/18/19 0828 96 %     Weight --      Height --      Head Circumference --  Peak Flow --      Pain Score 10/18/19 0838 2     Pain Loc --      Pain Edu? --      Excl. in [place]? --    No data found.  Updated Vital Signs BP (!) 141/87 (BP Location: Left Arm)   Pulse 79   Temp 98.2 F (36.8 C) (Oral)   Resp 18   SpO2 96%   Physical Exam Constitutional:      General: She is not in acute distress.    Appearance: She is well-developed. She is not ill-appearing, toxic-appearing or diaphoretic.  HENT:     Head: Normocephalic and atraumatic.  Eyes:     Conjunctiva/sclera: Conjunctivae normal.     Pupils: Pupils are equal, round, and reactive to light.  Cardiovascular:     Rate and Rhythm: Normal rate and regular rhythm.  Pulmonary:     Effort: Pulmonary effort is normal. No respiratory distress.     Comments: LCTAB Abdominal:     General: Bowel sounds are normal.     Palpations: Abdomen is soft.      Tenderness: There is abdominal tenderness in the epigastric area. There is no right CVA tenderness, left CVA tenderness, guarding or rebound.  Musculoskeletal:     Cervical back: Normal range of motion and neck supple.  Skin:    General: Skin is warm and dry.  Neurological:     Mental Status: She is alert and oriented to person, place, and time.  Psychiatric:        Behavior: Behavior normal.        Judgment: Judgment normal.      UC Treatments / Results  Labs (all labs ordered are listed, but only abnormal results are displayed) Labs Reviewed  POCT URINALYSIS DIP (MANUAL ENTRY) - Abnormal; Notable for the following components:      Result Value   Blood, UA trace-intact (*)    All other components within normal limits  POCT URINE PREGNANCY  CERVICOVAGINAL ANCILLARY ONLY    EKG   Radiology No results found.  Procedures Procedures (including critical care time)  Medications Ordered in UC Medications - No data to display  Initial Impression / Assessment and Plan / UC Course  I have reviewed the triage vital signs and the nursing notes.  Pertinent labs & imaging results that were available during my care of the patient were reviewed by me and considered in my medical decision making (see chart for details).    No alarming signs on exam. Patient afebrile, nontoxic. Stable vitals. Able to ambulate on own without difficulty. Epigastric tenderness without guarding or rebound, low suspicion for acute surgical abdomen. Will provide symptomatic treatment at this time. Push fluids. Patient requesting STD testing, cytology sent. Return precautions given. Patient expresses understanding and agrees to plan.  Final Clinical Impressions(s) / UC Diagnoses   Final diagnoses:  Intractable vomiting with nausea, unspecified vomiting type  Abdominal pain, epigastric   ED Prescriptions    Medication Sig Dispense Auth. Provider   ondansetron (ZOFRAN ODT) 4 MG disintegrating tablet Take 1  tablet (4 mg total) by mouth every 8 (eight) hours as needed for nausea or vomiting. 20 tablet Mattia Osterman V, PA-C   famotidine (PEPCID) 20 MG tablet Take 1 tablet (20 mg total) by mouth daily. 10 tablet Ok Edwards, PA-C     PDMP not reviewed this encounter.   Ok Edwards, PA-C 10/18/19 [phone]

## 2019-10-19 LAB — CERVICOVAGINAL ANCILLARY ONLY
Bacterial Vaginitis (gardnerella): NEGATIVE
Candida Glabrata: NEGATIVE
Candida Vaginitis: NEGATIVE
Chlamydia: NEGATIVE
Comment: NEGATIVE
Comment: NEGATIVE
Comment: NEGATIVE
Comment: NEGATIVE
Comment: NEGATIVE
Comment: NORMAL
Neisseria Gonorrhea: NEGATIVE
Trichomonas: NEGATIVE

## 2020-08-31 DIAGNOSIS — R87610 Atypical squamous cells of undetermined significance on cytologic smear of cervix (ASC-US): Secondary | ICD-10-CM | POA: Diagnosis not present

## 2020-08-31 DIAGNOSIS — Z124 Encounter for screening for malignant neoplasm of cervix: Secondary | ICD-10-CM | POA: Diagnosis not present

## 2020-08-31 DIAGNOSIS — R87810 Cervical high risk human papillomavirus (HPV) DNA test positive: Secondary | ICD-10-CM | POA: Diagnosis not present

## 2020-10-31 ENCOUNTER — Encounter: Payer: Self-pay | Admitting: *Deleted

## 2020-10-31 ENCOUNTER — Ambulatory Visit
Admission: EM | Admit: 2020-10-31 | Discharge: 2020-10-31 | Disposition: A | Discharge status: Home / Self Care | Payer: BC Managed Care – PPO

## 2020-10-31 ENCOUNTER — Other Ambulatory Visit: Payer: Self-pay

## 2020-10-31 DIAGNOSIS — U071 COVID-19: Secondary | ICD-10-CM | POA: Diagnosis not present

## 2020-10-31 DIAGNOSIS — R112 Nausea with vomiting, unspecified: Secondary | ICD-10-CM

## 2020-10-31 DIAGNOSIS — R197 Diarrhea, unspecified: Secondary | ICD-10-CM

## 2020-10-31 MED ORDER — ONDANSETRON 4 MG PO TBDP: 4.0000 mg | ORAL_TABLET | Freq: Three times a day (TID) | ORAL | 0 refills | Status: AC | PRN

## 2020-10-31 MED ORDER — ONDANSETRON 4 MG PO TBDP
4.0000 mg | ORAL_TABLET | Freq: Once | ORAL | Status: AC
  Administered 2020-10-31: 4 mg via ORAL

## 2020-10-31 NOTE — ED Notes (Signed)
Pt states she is in therapy and is getting help

## 2020-10-31 NOTE — ED Provider Notes (Signed)
[facility]    CSN: [id number] Arrival date & time: 10/31/20  2426      History   Chief Complaint Chief Complaint  Patient presents with   Covid +   Fever    HPI Candace Sanders is a 35 y.o. female.   Patient presenting today with 2-day history of fever, chills, body aches, sweats, congestion, cough and this morning having diarrhea and vomiting, nausea.  Denies chest pain, shortness of breath, intolerance to p.o., urinary symptoms, dizziness.  Took a home COVID test this morning that was positive.  Denies past medical problems.  Taking over-the-counter pain and fever reducers, cough drops with minimal relief.   Past Medical History:  Diagnosis Date   Anxiety    Asthma    childhood   Depression    Hypertension    no meds   PONV (postoperative nausea and vomiting)    nausea   Shortness of breath dyspnea     There are no problems to display for this patient.   Past Surgical History:  Procedure Laterality Date   ADENOIDECTOMY     ROBOTIC ASSISTED LAPAROSCOPIC PARTIAL CYSTECTOMY N/A 01/03/2015   Procedure: ROBOTIC ASSISTED  RIGHT OVARIAN CYSTECTOMY AND   PARTIALRIGHT SALPINGECTOMY;  Surgeon: Bobbye Charleston, MD;  Location: [facility];  Service: Gynecology;  Laterality: N/A;   SALPINGOOPHORECTOMY     TONSILLECTOMY      OB History   No obstetric history on file.      Home Medications    Prior to Admission medications   Medication Sig Start Date End Date Taking? Authorizing Provider  ACETAMINOPHEN PO Take by mouth.   Yes [provider]  Ascorbic Acid (VITAMIN C PO) Take by mouth.   Yes [provider]  diphenhydrAMINE HCl (BENADRYL PO) Take by mouth.   Yes [provider]  norgestimate-ethinyl estradiol (ORTHO-CYCLEN) 0.25-35 MG-MCG tablet Take 1 tablet by mouth daily.   Yes [provider]  ondansetron (ZOFRAN ODT) 4 MG disintegrating tablet Take 1 tablet (4 mg total) by mouth every 8 (eight) hours as needed for  nausea or vomiting. 10/31/20  Yes Volney American, PA-C  famotidine (PEPCID) 20 MG tablet Take 1 tablet (20 mg total) by mouth daily. 10/18/19   Tasia Catchings, Amy V, PA-C  ondansetron (ZOFRAN ODT) 4 MG disintegrating tablet Take 1 tablet (4 mg total) by mouth every 8 (eight) hours as needed for nausea or vomiting. 10/18/19   Ok Edwards, PA-C    Family History Family History  Problem Relation Age of Onset   Hyperlipidemia Mother    Hypertension Father     Social History Social History   Tobacco Use   Smoking status: Former    Pack years: 0.00    Types: Cigarettes    Quit date: 10/30/2010    Years since quitting: 10.0   Smokeless tobacco: Never   Tobacco comments:    vape  Vaping Use   Vaping Use: Never used  Substance Use Topics   Alcohol use: Yes    Comment: occasionally   Drug use: Yes    Types: Marijuana     Allergies   Patient has no known allergies.   Review of Systems Review of Systems Per HPI  Physical Exam Triage Vital Signs ED Triage Vitals  Enc Vitals Group     BP 10/31/20 1236 136/81     Pulse Rate 10/31/20 1236 (!) 112     Resp 10/31/20 1236 20     Temp 10/31/20  1236 100.1 F (37.8 C)     Temp Source 10/31/20 1236 Oral     SpO2 10/31/20 1236 95 %     Weight --      Height --      Head Circumference --      Peak Flow --      Pain Score 10/31/20 1230 6     Pain Loc --      Pain Edu? --      Excl. in [place]? --    No data found.  Updated Vital Signs BP 136/81   Pulse (!) 112   Temp 100.1 F (37.8 C) (Oral)   Resp 20   LMP 10/26/2020 (Exact Date)   SpO2 95%   Visual Acuity Right Eye Distance:   Left Eye Distance:   Bilateral Distance:    Right Eye Near:   Left Eye Near:    Bilateral Near:     Physical Exam Vitals and nursing note reviewed.  Constitutional:      Appearance: Normal appearance. She is not ill-appearing.  HENT:     Head: Atraumatic.     Right Ear: Tympanic membrane normal.     Left Ear: Tympanic membrane normal.     Nose:  Nose normal.     Mouth/Throat:     Mouth: Mucous membranes are moist.     Pharynx: Oropharynx is clear. Posterior oropharyngeal erythema present. No oropharyngeal exudate.  Eyes:     Extraocular Movements: Extraocular movements intact.     Conjunctiva/sclera: Conjunctivae normal.  Cardiovascular:     Rate and Rhythm: Normal rate and regular rhythm.     Heart sounds: Normal heart sounds.  Pulmonary:     Effort: Pulmonary effort is normal. No respiratory distress.     Breath sounds: Normal breath sounds. No wheezing or rales.  Abdominal:     General: Bowel sounds are normal. There is no distension.     Palpations: Abdomen is soft.     Tenderness: There is no abdominal tenderness. There is no right CVA tenderness, left CVA tenderness or guarding.  Musculoskeletal:        General: Normal range of motion.     Cervical back: Normal range of motion and neck supple.  Skin:    General: Skin is warm and dry.  Neurological:     Mental Status: She is alert and oriented to person, place, and time.  Psychiatric:        Mood and Affect: Mood normal.        Thought Content: Thought content normal.        Judgment: Judgment normal.   UC Treatments / Results  Labs (all labs ordered are listed, but only abnormal results are displayed) Labs Reviewed - No data to display  EKG  Radiology No results found.  Procedures Procedures (including critical care time)  Medications Ordered in UC Medications  ondansetron (ZOFRAN-ODT) disintegrating tablet 4 mg (4 mg Oral Given 10/31/20 1241)    Initial Impression / Assessment and Plan / UC Course  I have reviewed the triage vital signs and the nursing notes.  Pertinent labs & imaging results that were available during my care of the patient were reviewed by me and considered in my medical decision making (see chart for details).     Mildly febrile and tachycardic in triage, otherwise vital signs reassuring and exam overall reassuring today.  Home  COVID test positive.  Given Zofran in clinic for active nausea and vomiting which she states  has already started helping with her symptoms.  Will send home with Zofran for as needed use, discussed brat diet, rest, fluids, DayQuil and NyQuil as needed for congestion and cough.  Work note given with isolation instructions.  Strict return precautions given for acutely worsening symptoms.  Final Clinical Impressions(s) / UC Diagnoses   Final diagnoses:  COVID-19  Nausea vomiting and diarrhea   Discharge Instructions   None    ED Prescriptions     Medication Sig Dispense Auth. Provider   ondansetron (ZOFRAN ODT) 4 MG disintegrating tablet Take 1 tablet (4 mg total) by mouth every 8 (eight) hours as needed for nausea or vomiting. 20 tablet Volney American, [place]      PDMP not reviewed this encounter.   Volney American, [place] 10/31/20 1257

## 2020-10-31 NOTE — ED Triage Notes (Signed)
C/O fever up to 102.8 onset 2 days ago.  Had positive Covid test at home this AM.  C/O loose stools and couple episodes vomiting.  Also states her menstrual period is going much longer than usual.  C/O slight cough.

## 2021-02-19 ENCOUNTER — Other Ambulatory Visit: Payer: Self-pay

## 2021-02-19 ENCOUNTER — Encounter: Payer: BC Managed Care – PPO | Admitting: Allergy and Immunology

## 2021-02-20 NOTE — Progress Notes (Signed)
This encounter was created in error - please disregard.

## 2021-04-23 ENCOUNTER — Ambulatory Visit: Payer: Self-pay | Admitting: Allergy and Immunology

## 2021-09-13 DIAGNOSIS — Z0000 Encounter for general adult medical examination without abnormal findings: Secondary | ICD-10-CM | POA: Diagnosis not present

## 2021-09-13 DIAGNOSIS — Z23 Encounter for immunization: Secondary | ICD-10-CM | POA: Diagnosis not present

## 2021-09-13 DIAGNOSIS — R030 Elevated blood-pressure reading, without diagnosis of hypertension: Secondary | ICD-10-CM | POA: Diagnosis not present

## 2021-09-13 DIAGNOSIS — Z13220 Encounter for screening for lipoid disorders: Secondary | ICD-10-CM | POA: Diagnosis not present

## 2021-09-19 DIAGNOSIS — Z6841 Body Mass Index (BMI) 40.0 and over, adult: Secondary | ICD-10-CM | POA: Diagnosis not present

## 2021-09-19 DIAGNOSIS — Z113 Encounter for screening for infections with a predominantly sexual mode of transmission: Secondary | ICD-10-CM | POA: Diagnosis not present

## 2021-09-19 DIAGNOSIS — Z01419 Encounter for gynecological examination (general) (routine) without abnormal findings: Secondary | ICD-10-CM | POA: Diagnosis not present

## 2021-09-19 DIAGNOSIS — Z124 Encounter for screening for malignant neoplasm of cervix: Secondary | ICD-10-CM | POA: Diagnosis not present

## 2022-09-22 DIAGNOSIS — Z124 Encounter for screening for malignant neoplasm of cervix: Secondary | ICD-10-CM | POA: Diagnosis not present

## 2022-09-22 DIAGNOSIS — Z113 Encounter for screening for infections with a predominantly sexual mode of transmission: Secondary | ICD-10-CM | POA: Diagnosis not present

## 2022-09-22 DIAGNOSIS — Z01419 Encounter for gynecological examination (general) (routine) without abnormal findings: Secondary | ICD-10-CM | POA: Diagnosis not present

## 2022-09-22 DIAGNOSIS — Z6841 Body Mass Index (BMI) 40.0 and over, adult: Secondary | ICD-10-CM | POA: Diagnosis not present

## 2022-10-20 DIAGNOSIS — F4323 Adjustment disorder with mixed anxiety and depressed mood: Secondary | ICD-10-CM | POA: Diagnosis not present

## 2022-10-30 DIAGNOSIS — Z3202 Encounter for pregnancy test, result negative: Secondary | ICD-10-CM | POA: Diagnosis not present

## 2022-10-30 DIAGNOSIS — Z30430 Encounter for insertion of intrauterine contraceptive device: Secondary | ICD-10-CM | POA: Diagnosis not present

## 2022-10-30 DIAGNOSIS — Z6841 Body Mass Index (BMI) 40.0 and over, adult: Secondary | ICD-10-CM | POA: Diagnosis not present

## 2022-11-12 DIAGNOSIS — F4323 Adjustment disorder with mixed anxiety and depressed mood: Secondary | ICD-10-CM | POA: Diagnosis not present

## 2022-11-26 DIAGNOSIS — F4323 Adjustment disorder with mixed anxiety and depressed mood: Secondary | ICD-10-CM | POA: Diagnosis not present

## 2022-12-10 DIAGNOSIS — F4323 Adjustment disorder with mixed anxiety and depressed mood: Secondary | ICD-10-CM | POA: Diagnosis not present

## 2023-01-07 DIAGNOSIS — F4323 Adjustment disorder with mixed anxiety and depressed mood: Secondary | ICD-10-CM | POA: Diagnosis not present

## 2023-01-21 DIAGNOSIS — F4323 Adjustment disorder with mixed anxiety and depressed mood: Secondary | ICD-10-CM | POA: Diagnosis not present

## 2023-02-04 DIAGNOSIS — F4323 Adjustment disorder with mixed anxiety and depressed mood: Secondary | ICD-10-CM | POA: Diagnosis not present

## 2023-02-18 DIAGNOSIS — F4323 Adjustment disorder with mixed anxiety and depressed mood: Secondary | ICD-10-CM | POA: Diagnosis not present

## 2023-03-04 DIAGNOSIS — F4323 Adjustment disorder with mixed anxiety and depressed mood: Secondary | ICD-10-CM | POA: Diagnosis not present

## 2023-03-18 DIAGNOSIS — F4323 Adjustment disorder with mixed anxiety and depressed mood: Secondary | ICD-10-CM | POA: Diagnosis not present

## 2023-04-01 DIAGNOSIS — F4323 Adjustment disorder with mixed anxiety and depressed mood: Secondary | ICD-10-CM | POA: Diagnosis not present

## 2023-04-02 DIAGNOSIS — K5900 Constipation, unspecified: Secondary | ICD-10-CM | POA: Diagnosis not present

## 2023-04-02 DIAGNOSIS — H00014 Hordeolum externum left upper eyelid: Secondary | ICD-10-CM | POA: Diagnosis not present

## 2024-07-06 ENCOUNTER — Ambulatory Visit: Payer: Self-pay | Admitting: Family
